# Patient Record
Sex: Male | Born: 1955 | Race: White | Hispanic: No | Marital: Married | Smoking: Never smoker
Health system: Southern US, Community
[De-identification: ages and names within clinical notes are randomized; demographics above are authoritative.]

## PROBLEM LIST (undated history)

## (undated) DIAGNOSIS — J3089 Other allergic rhinitis: Secondary | ICD-10-CM

## (undated) DIAGNOSIS — M75102 Unspecified rotator cuff tear or rupture of left shoulder, not specified as traumatic: Secondary | ICD-10-CM

## (undated) DIAGNOSIS — M1990 Unspecified osteoarthritis, unspecified site: Secondary | ICD-10-CM

## (undated) DIAGNOSIS — G4730 Sleep apnea, unspecified: Secondary | ICD-10-CM

## (undated) DIAGNOSIS — I10 Essential (primary) hypertension: Secondary | ICD-10-CM

## (undated) HISTORY — PX: TONSILLECTOMY: SUR1361

---

## 2001-07-05 ENCOUNTER — Ambulatory Visit (HOSPITAL_BASED_OUTPATIENT_CLINIC_OR_DEPARTMENT_OTHER): Admission: RE | Admit: 2001-07-05 | Discharge: 2001-07-05 | Payer: Self-pay | Admitting: Family Medicine

## 2016-02-28 HISTORY — PX: SHOULDER ARTHROSCOPY WITH ROTATOR CUFF REPAIR: SHX5685

## 2016-02-29 DIAGNOSIS — M25511 Pain in right shoulder: Secondary | ICD-10-CM | POA: Diagnosis not present

## 2016-06-13 DIAGNOSIS — Z0000 Encounter for general adult medical examination without abnormal findings: Secondary | ICD-10-CM | POA: Diagnosis not present

## 2016-06-13 DIAGNOSIS — J309 Allergic rhinitis, unspecified: Secondary | ICD-10-CM | POA: Diagnosis not present

## 2016-06-13 DIAGNOSIS — Z125 Encounter for screening for malignant neoplasm of prostate: Secondary | ICD-10-CM | POA: Diagnosis not present

## 2016-06-13 DIAGNOSIS — I10 Essential (primary) hypertension: Secondary | ICD-10-CM | POA: Diagnosis not present

## 2016-06-13 DIAGNOSIS — Z1159 Encounter for screening for other viral diseases: Secondary | ICD-10-CM | POA: Diagnosis not present

## 2016-07-06 DIAGNOSIS — M25511 Pain in right shoulder: Secondary | ICD-10-CM | POA: Diagnosis not present

## 2016-08-08 DIAGNOSIS — M67911 Unspecified disorder of synovium and tendon, right shoulder: Secondary | ICD-10-CM | POA: Diagnosis not present

## 2016-08-09 DIAGNOSIS — G4733 Obstructive sleep apnea (adult) (pediatric): Secondary | ICD-10-CM | POA: Diagnosis not present

## 2016-09-11 DIAGNOSIS — M25511 Pain in right shoulder: Secondary | ICD-10-CM | POA: Diagnosis not present

## 2016-09-13 DIAGNOSIS — M25511 Pain in right shoulder: Secondary | ICD-10-CM | POA: Diagnosis not present

## 2016-09-13 DIAGNOSIS — M67911 Unspecified disorder of synovium and tendon, right shoulder: Secondary | ICD-10-CM | POA: Diagnosis not present

## 2016-10-17 DIAGNOSIS — M25561 Pain in right knee: Secondary | ICD-10-CM | POA: Diagnosis not present

## 2016-11-21 DIAGNOSIS — S43431A Superior glenoid labrum lesion of right shoulder, initial encounter: Secondary | ICD-10-CM | POA: Diagnosis not present

## 2016-11-21 DIAGNOSIS — M25511 Pain in right shoulder: Secondary | ICD-10-CM | POA: Diagnosis not present

## 2016-11-21 DIAGNOSIS — G8929 Other chronic pain: Secondary | ICD-10-CM | POA: Diagnosis not present

## 2016-11-29 DIAGNOSIS — Z23 Encounter for immunization: Secondary | ICD-10-CM | POA: Diagnosis not present

## 2016-12-06 DIAGNOSIS — G4733 Obstructive sleep apnea (adult) (pediatric): Secondary | ICD-10-CM | POA: Diagnosis not present

## 2016-12-13 DIAGNOSIS — I10 Essential (primary) hypertension: Secondary | ICD-10-CM | POA: Diagnosis not present

## 2016-12-13 DIAGNOSIS — J309 Allergic rhinitis, unspecified: Secondary | ICD-10-CM | POA: Diagnosis not present

## 2016-12-18 DIAGNOSIS — G4733 Obstructive sleep apnea (adult) (pediatric): Secondary | ICD-10-CM | POA: Diagnosis not present

## 2017-01-01 DIAGNOSIS — S43431A Superior glenoid labrum lesion of right shoulder, initial encounter: Secondary | ICD-10-CM | POA: Diagnosis not present

## 2017-01-01 DIAGNOSIS — G8918 Other acute postprocedural pain: Secondary | ICD-10-CM | POA: Diagnosis not present

## 2017-01-01 DIAGNOSIS — M75111 Incomplete rotator cuff tear or rupture of right shoulder, not specified as traumatic: Secondary | ICD-10-CM | POA: Diagnosis not present

## 2017-01-01 DIAGNOSIS — M19011 Primary osteoarthritis, right shoulder: Secondary | ICD-10-CM | POA: Diagnosis not present

## 2017-01-01 DIAGNOSIS — S46011A Strain of muscle(s) and tendon(s) of the rotator cuff of right shoulder, initial encounter: Secondary | ICD-10-CM | POA: Diagnosis not present

## 2017-01-04 DIAGNOSIS — M25511 Pain in right shoulder: Secondary | ICD-10-CM | POA: Diagnosis not present

## 2017-01-08 DIAGNOSIS — M25511 Pain in right shoulder: Secondary | ICD-10-CM | POA: Diagnosis not present

## 2017-01-10 DIAGNOSIS — M25511 Pain in right shoulder: Secondary | ICD-10-CM | POA: Diagnosis not present

## 2017-01-15 DIAGNOSIS — M25511 Pain in right shoulder: Secondary | ICD-10-CM | POA: Diagnosis not present

## 2017-01-17 DIAGNOSIS — M25511 Pain in right shoulder: Secondary | ICD-10-CM | POA: Diagnosis not present

## 2017-01-18 DIAGNOSIS — G4733 Obstructive sleep apnea (adult) (pediatric): Secondary | ICD-10-CM | POA: Diagnosis not present

## 2017-01-22 DIAGNOSIS — M25511 Pain in right shoulder: Secondary | ICD-10-CM | POA: Diagnosis not present

## 2017-01-24 DIAGNOSIS — M25511 Pain in right shoulder: Secondary | ICD-10-CM | POA: Diagnosis not present

## 2017-01-29 DIAGNOSIS — M25511 Pain in right shoulder: Secondary | ICD-10-CM | POA: Diagnosis not present

## 2017-01-31 DIAGNOSIS — M25511 Pain in right shoulder: Secondary | ICD-10-CM | POA: Diagnosis not present

## 2017-02-07 DIAGNOSIS — M25511 Pain in right shoulder: Secondary | ICD-10-CM | POA: Diagnosis not present

## 2017-02-09 DIAGNOSIS — M25511 Pain in right shoulder: Secondary | ICD-10-CM | POA: Diagnosis not present

## 2017-02-13 DIAGNOSIS — M25511 Pain in right shoulder: Secondary | ICD-10-CM | POA: Diagnosis not present

## 2017-02-15 DIAGNOSIS — M25511 Pain in right shoulder: Secondary | ICD-10-CM | POA: Diagnosis not present

## 2017-02-17 DIAGNOSIS — G4733 Obstructive sleep apnea (adult) (pediatric): Secondary | ICD-10-CM | POA: Diagnosis not present

## 2017-02-22 DIAGNOSIS — M25511 Pain in right shoulder: Secondary | ICD-10-CM | POA: Diagnosis not present

## 2017-02-26 DIAGNOSIS — M25511 Pain in right shoulder: Secondary | ICD-10-CM | POA: Diagnosis not present

## 2017-03-01 DIAGNOSIS — M25511 Pain in right shoulder: Secondary | ICD-10-CM | POA: Diagnosis not present

## 2017-03-05 DIAGNOSIS — M25511 Pain in right shoulder: Secondary | ICD-10-CM | POA: Diagnosis not present

## 2017-03-08 DIAGNOSIS — M25511 Pain in right shoulder: Secondary | ICD-10-CM | POA: Diagnosis not present

## 2017-03-12 DIAGNOSIS — G4733 Obstructive sleep apnea (adult) (pediatric): Secondary | ICD-10-CM | POA: Diagnosis not present

## 2017-03-13 DIAGNOSIS — M25519 Pain in unspecified shoulder: Secondary | ICD-10-CM | POA: Diagnosis not present

## 2017-03-15 DIAGNOSIS — M25519 Pain in unspecified shoulder: Secondary | ICD-10-CM | POA: Diagnosis not present

## 2017-03-20 DIAGNOSIS — M25511 Pain in right shoulder: Secondary | ICD-10-CM | POA: Diagnosis not present

## 2017-03-20 DIAGNOSIS — G4733 Obstructive sleep apnea (adult) (pediatric): Secondary | ICD-10-CM | POA: Diagnosis not present

## 2017-03-22 DIAGNOSIS — M25511 Pain in right shoulder: Secondary | ICD-10-CM | POA: Diagnosis not present

## 2017-03-26 DIAGNOSIS — M25511 Pain in right shoulder: Secondary | ICD-10-CM | POA: Diagnosis not present

## 2017-03-29 DIAGNOSIS — M25519 Pain in unspecified shoulder: Secondary | ICD-10-CM | POA: Diagnosis not present

## 2017-04-02 DIAGNOSIS — M25511 Pain in right shoulder: Secondary | ICD-10-CM | POA: Diagnosis not present

## 2017-04-06 DIAGNOSIS — M25519 Pain in unspecified shoulder: Secondary | ICD-10-CM | POA: Diagnosis not present

## 2017-04-10 DIAGNOSIS — M25511 Pain in right shoulder: Secondary | ICD-10-CM | POA: Diagnosis not present

## 2017-04-11 DIAGNOSIS — M25511 Pain in right shoulder: Secondary | ICD-10-CM | POA: Diagnosis not present

## 2017-04-18 DIAGNOSIS — M25511 Pain in right shoulder: Secondary | ICD-10-CM | POA: Diagnosis not present

## 2017-04-20 DIAGNOSIS — G4733 Obstructive sleep apnea (adult) (pediatric): Secondary | ICD-10-CM | POA: Diagnosis not present

## 2017-04-25 DIAGNOSIS — M25511 Pain in right shoulder: Secondary | ICD-10-CM | POA: Diagnosis not present

## 2017-05-02 DIAGNOSIS — M25511 Pain in right shoulder: Secondary | ICD-10-CM | POA: Diagnosis not present

## 2017-05-09 DIAGNOSIS — M25511 Pain in right shoulder: Secondary | ICD-10-CM | POA: Diagnosis not present

## 2017-05-17 DIAGNOSIS — M25511 Pain in right shoulder: Secondary | ICD-10-CM | POA: Diagnosis not present

## 2017-05-17 DIAGNOSIS — M25519 Pain in unspecified shoulder: Secondary | ICD-10-CM | POA: Diagnosis not present

## 2017-05-18 DIAGNOSIS — G4733 Obstructive sleep apnea (adult) (pediatric): Secondary | ICD-10-CM | POA: Diagnosis not present

## 2017-06-18 DIAGNOSIS — G4733 Obstructive sleep apnea (adult) (pediatric): Secondary | ICD-10-CM | POA: Diagnosis not present

## 2017-06-19 DIAGNOSIS — I10 Essential (primary) hypertension: Secondary | ICD-10-CM | POA: Diagnosis not present

## 2017-06-19 DIAGNOSIS — Z0000 Encounter for general adult medical examination without abnormal findings: Secondary | ICD-10-CM | POA: Diagnosis not present

## 2017-06-19 DIAGNOSIS — J309 Allergic rhinitis, unspecified: Secondary | ICD-10-CM | POA: Diagnosis not present

## 2017-06-19 DIAGNOSIS — R7303 Prediabetes: Secondary | ICD-10-CM | POA: Diagnosis not present

## 2017-07-18 DIAGNOSIS — G4733 Obstructive sleep apnea (adult) (pediatric): Secondary | ICD-10-CM | POA: Diagnosis not present

## 2017-08-15 DIAGNOSIS — K5730 Diverticulosis of large intestine without perforation or abscess without bleeding: Secondary | ICD-10-CM | POA: Diagnosis not present

## 2017-08-15 DIAGNOSIS — Z86010 Personal history of colonic polyps: Secondary | ICD-10-CM | POA: Diagnosis not present

## 2017-08-18 DIAGNOSIS — G4733 Obstructive sleep apnea (adult) (pediatric): Secondary | ICD-10-CM | POA: Diagnosis not present

## 2017-09-17 DIAGNOSIS — G4733 Obstructive sleep apnea (adult) (pediatric): Secondary | ICD-10-CM | POA: Diagnosis not present

## 2017-12-19 DIAGNOSIS — Z23 Encounter for immunization: Secondary | ICD-10-CM | POA: Diagnosis not present

## 2017-12-19 DIAGNOSIS — J309 Allergic rhinitis, unspecified: Secondary | ICD-10-CM | POA: Diagnosis not present

## 2017-12-19 DIAGNOSIS — R7303 Prediabetes: Secondary | ICD-10-CM | POA: Diagnosis not present

## 2017-12-19 DIAGNOSIS — I10 Essential (primary) hypertension: Secondary | ICD-10-CM | POA: Diagnosis not present

## 2018-02-15 DIAGNOSIS — G4733 Obstructive sleep apnea (adult) (pediatric): Secondary | ICD-10-CM | POA: Diagnosis not present

## 2018-03-13 DIAGNOSIS — G4733 Obstructive sleep apnea (adult) (pediatric): Secondary | ICD-10-CM | POA: Diagnosis not present

## 2018-06-25 DIAGNOSIS — Z0000 Encounter for general adult medical examination without abnormal findings: Secondary | ICD-10-CM | POA: Diagnosis not present

## 2018-06-25 DIAGNOSIS — R7303 Prediabetes: Secondary | ICD-10-CM | POA: Diagnosis not present

## 2018-06-25 DIAGNOSIS — I10 Essential (primary) hypertension: Secondary | ICD-10-CM | POA: Diagnosis not present

## 2018-06-25 DIAGNOSIS — J309 Allergic rhinitis, unspecified: Secondary | ICD-10-CM | POA: Diagnosis not present

## 2018-07-02 DIAGNOSIS — I10 Essential (primary) hypertension: Secondary | ICD-10-CM | POA: Diagnosis not present

## 2018-07-02 DIAGNOSIS — R7303 Prediabetes: Secondary | ICD-10-CM | POA: Diagnosis not present

## 2018-11-06 ENCOUNTER — Other Ambulatory Visit: Payer: Self-pay

## 2018-11-06 ENCOUNTER — Ambulatory Visit
Admission: RE | Admit: 2018-11-06 | Discharge: 2018-11-06 | Disposition: A | Discharge status: Home / Self Care | Payer: Self-pay | Source: Ambulatory Visit | Attending: Family Medicine | Admitting: Family Medicine

## 2018-11-06 ENCOUNTER — Other Ambulatory Visit: Payer: Self-pay | Admitting: Family Medicine

## 2018-11-06 DIAGNOSIS — R059 Cough, unspecified: Secondary | ICD-10-CM

## 2018-11-06 DIAGNOSIS — Z23 Encounter for immunization: Secondary | ICD-10-CM | POA: Diagnosis not present

## 2018-11-06 DIAGNOSIS — J309 Allergic rhinitis, unspecified: Secondary | ICD-10-CM | POA: Diagnosis not present

## 2018-11-06 DIAGNOSIS — I10 Essential (primary) hypertension: Secondary | ICD-10-CM | POA: Diagnosis not present

## 2018-11-06 DIAGNOSIS — R05 Cough: Secondary | ICD-10-CM

## 2018-11-06 DIAGNOSIS — Z6835 Body mass index (BMI) 35.0-35.9, adult: Secondary | ICD-10-CM | POA: Diagnosis not present

## 2019-01-02 DIAGNOSIS — R7303 Prediabetes: Secondary | ICD-10-CM | POA: Diagnosis not present

## 2019-01-02 DIAGNOSIS — J309 Allergic rhinitis, unspecified: Secondary | ICD-10-CM | POA: Diagnosis not present

## 2019-01-02 DIAGNOSIS — I10 Essential (primary) hypertension: Secondary | ICD-10-CM | POA: Diagnosis not present

## 2019-03-12 DIAGNOSIS — G4733 Obstructive sleep apnea (adult) (pediatric): Secondary | ICD-10-CM | POA: Diagnosis not present

## 2019-03-24 DIAGNOSIS — G4733 Obstructive sleep apnea (adult) (pediatric): Secondary | ICD-10-CM | POA: Diagnosis not present

## 2019-04-24 ENCOUNTER — Ambulatory Visit: Payer: PRIVATE HEALTH INSURANCE | Attending: Internal Medicine

## 2019-04-24 DIAGNOSIS — Z23 Encounter for immunization: Secondary | ICD-10-CM | POA: Insufficient documentation

## 2019-04-24 NOTE — Progress Notes (Signed)
   Covid-19 Vaccination Clinic  Name:  Alfred Davis    MRN: [id number] DOB: 1955/12/02  04/24/2019  Alfred Davis was observed post Covid-19 immunization for 15 minutes without incidence. He was provided with Vaccine Information Sheet and instruction to access the V-Safe system.   Alfred Davis was instructed to call 911 with any severe reactions post vaccine: Marland Kitchen Difficulty breathing  . Swelling of your face and throat  . A fast heartbeat  . A bad rash all over your body  . Dizziness and weakness    Immunizations Administered    Name Date Dose VIS Date Route   Pfizer COVID-19 Vaccine 04/24/2019  5:08 PM 0.3 mL 02/07/2019 Intramuscular   Manufacturer: [employer], [employer]   Lot: [id number]   NDC: [address]-6

## 2019-05-20 ENCOUNTER — Ambulatory Visit: Payer: PRIVATE HEALTH INSURANCE | Attending: Internal Medicine

## 2019-05-20 DIAGNOSIS — Z23 Encounter for immunization: Secondary | ICD-10-CM

## 2019-05-20 NOTE — Progress Notes (Signed)
   Covid-19 Vaccination Clinic  Name:  Alfred Davis    MRN: [id number] DOB: 1955-06-25  05/20/2019  Alfred Davis was observed post Covid-19 immunization for 15 minutes without incident. He was provided with Vaccine Information Sheet and instruction to access the V-Safe system.   Alfred Davis was instructed to call 911 with any severe reactions post vaccine: Marland Kitchen Difficulty breathing  . Swelling of face and throat  . A fast heartbeat  . A bad rash all over body  . Dizziness and weakness   Immunizations Administered    Name Date Dose VIS Date Route   Pfizer COVID-19 Vaccine 05/20/2019 10:48 AM 0.3 mL 02/07/2019 Intramuscular   Manufacturer: [employer], [employer]   Lot: [id number]   NDC: [address]-0

## 2019-07-02 DIAGNOSIS — Z0000 Encounter for general adult medical examination without abnormal findings: Secondary | ICD-10-CM | POA: Diagnosis not present

## 2019-07-02 DIAGNOSIS — R7309 Other abnormal glucose: Secondary | ICD-10-CM | POA: Diagnosis not present

## 2019-07-02 DIAGNOSIS — J309 Allergic rhinitis, unspecified: Secondary | ICD-10-CM | POA: Diagnosis not present

## 2019-07-02 DIAGNOSIS — Z125 Encounter for screening for malignant neoplasm of prostate: Secondary | ICD-10-CM | POA: Diagnosis not present

## 2019-07-02 DIAGNOSIS — I10 Essential (primary) hypertension: Secondary | ICD-10-CM | POA: Diagnosis not present

## 2019-11-25 ENCOUNTER — Ambulatory Visit: Payer: Self-pay | Attending: Internal Medicine

## 2019-11-25 DIAGNOSIS — Z23 Encounter for immunization: Secondary | ICD-10-CM

## 2019-11-25 NOTE — Progress Notes (Signed)
   Covid-19 Vaccination Clinic  Name:  Heraclio Seidman    MRN: [id number] DOB: 19-May-1955  11/25/2019  Mr. Keep was observed post Covid-19 immunization for 15 minutes without incident. He was provided with Vaccine Information Sheet and instruction to access the V-Safe system.   Mr. Martin was instructed to call 911 with any severe reactions post vaccine: Marland Kitchen Difficulty breathing  . Swelling of face and throat  . A fast heartbeat  . A bad rash all over body  . Dizziness and weakness

## 2020-01-09 ENCOUNTER — Other Ambulatory Visit: Payer: Self-pay | Admitting: Orthopedic Surgery

## 2020-01-09 DIAGNOSIS — M25512 Pain in left shoulder: Secondary | ICD-10-CM

## 2020-02-01 ENCOUNTER — Other Ambulatory Visit: Payer: Self-pay

## 2020-02-01 ENCOUNTER — Ambulatory Visit
Admission: RE | Admit: 2020-02-01 | Discharge: 2020-02-01 | Disposition: A | Discharge status: Home / Self Care | Payer: 59 | Source: Ambulatory Visit | Attending: Orthopedic Surgery | Admitting: Orthopedic Surgery

## 2020-02-01 DIAGNOSIS — M25512 Pain in left shoulder: Secondary | ICD-10-CM

## 2020-02-26 ENCOUNTER — Other Ambulatory Visit: Payer: Self-pay | Admitting: Orthopedic Surgery

## 2020-03-09 ENCOUNTER — Encounter (HOSPITAL_BASED_OUTPATIENT_CLINIC_OR_DEPARTMENT_OTHER): Payer: Self-pay | Admitting: Orthopedic Surgery

## 2020-03-09 ENCOUNTER — Other Ambulatory Visit: Payer: Self-pay

## 2020-03-11 ENCOUNTER — Other Ambulatory Visit (HOSPITAL_COMMUNITY): Payer: 59

## 2020-03-12 ENCOUNTER — Encounter (HOSPITAL_BASED_OUTPATIENT_CLINIC_OR_DEPARTMENT_OTHER)
Admission: RE | Admit: 2020-03-12 | Discharge: 2020-03-12 | Disposition: A | Discharge status: Home / Self Care | Payer: 59 | Source: Ambulatory Visit | Attending: Orthopedic Surgery | Admitting: Orthopedic Surgery

## 2020-03-12 ENCOUNTER — Other Ambulatory Visit: Payer: Self-pay

## 2020-03-12 ENCOUNTER — Other Ambulatory Visit (HOSPITAL_COMMUNITY)
Admission: RE | Admit: 2020-03-12 | Discharge: 2020-03-12 | Disposition: A | Discharge status: Home / Self Care | Payer: 59 | Source: Ambulatory Visit | Attending: Orthopedic Surgery | Admitting: Orthopedic Surgery

## 2020-03-12 DIAGNOSIS — Z20822 Contact with and (suspected) exposure to covid-19: Secondary | ICD-10-CM | POA: Insufficient documentation

## 2020-03-12 DIAGNOSIS — Z01818 Encounter for other preprocedural examination: Secondary | ICD-10-CM | POA: Diagnosis not present

## 2020-03-12 DIAGNOSIS — Z01812 Encounter for preprocedural laboratory examination: Secondary | ICD-10-CM | POA: Insufficient documentation

## 2020-03-12 DIAGNOSIS — I10 Essential (primary) hypertension: Secondary | ICD-10-CM | POA: Diagnosis not present

## 2020-03-12 LAB — BASIC METABOLIC PANEL
Anion gap: 9 (ref 5–15)
BUN: 15 mg/dL (ref 8–23)
CO2: 26 mmol/L (ref 22–32)
Calcium: 9.4 mg/dL (ref 8.9–10.3)
Chloride: 102 mmol/L (ref 98–111)
Creatinine, Ser: 1.03 mg/dL (ref 0.61–1.24)
GFR, Estimated: >60 mL/min (ref >60)
Glucose, Bld: 114 mg/dL — ABNORMAL HIGH (ref 70–99)
Potassium: 4.2 mmol/L (ref 3.5–5.1)
Sodium: 137 mmol/L (ref 135–145)

## 2020-03-12 LAB — SARS CORONAVIRUS 2 (TAT 6-24 HRS): SARS Coronavirus 2: NEGATIVE

## 2020-03-12 NOTE — Progress Notes (Signed)
EKG reviewed with Dr. Krista Blue. Ok to proceed with surgery as scheduled.

## 2020-03-12 NOTE — Progress Notes (Signed)

## 2020-03-15 ENCOUNTER — Ambulatory Visit (HOSPITAL_BASED_OUTPATIENT_CLINIC_OR_DEPARTMENT_OTHER)
Admission: RE | Admit: 2020-03-15 | Discharge: 2020-03-15 | Disposition: A | Discharge status: Home / Self Care | Payer: 59 | Attending: Orthopedic Surgery | Admitting: Orthopedic Surgery

## 2020-03-15 ENCOUNTER — Encounter (HOSPITAL_BASED_OUTPATIENT_CLINIC_OR_DEPARTMENT_OTHER): Payer: Self-pay | Admitting: Orthopedic Surgery

## 2020-03-15 ENCOUNTER — Ambulatory Visit (HOSPITAL_BASED_OUTPATIENT_CLINIC_OR_DEPARTMENT_OTHER): Payer: 59 | Admitting: Anesthesiology

## 2020-03-15 ENCOUNTER — Other Ambulatory Visit: Payer: Self-pay

## 2020-03-15 ENCOUNTER — Encounter (HOSPITAL_BASED_OUTPATIENT_CLINIC_OR_DEPARTMENT_OTHER)
Admission: RE | Disposition: A | Discharge status: Home / Self Care | Payer: Self-pay | Source: Home / Self Care | Attending: Orthopedic Surgery

## 2020-03-15 DIAGNOSIS — Z79899 Other long term (current) drug therapy: Secondary | ICD-10-CM | POA: Insufficient documentation

## 2020-03-15 DIAGNOSIS — X58XXXA Exposure to other specified factors, initial encounter: Secondary | ICD-10-CM | POA: Insufficient documentation

## 2020-03-15 DIAGNOSIS — M24412 Recurrent dislocation, left shoulder: Secondary | ICD-10-CM | POA: Insufficient documentation

## 2020-03-15 DIAGNOSIS — S46012A Strain of muscle(s) and tendon(s) of the rotator cuff of left shoulder, initial encounter: Secondary | ICD-10-CM | POA: Diagnosis not present

## 2020-03-15 DIAGNOSIS — M7502 Adhesive capsulitis of left shoulder: Secondary | ICD-10-CM | POA: Diagnosis not present

## 2020-03-15 DIAGNOSIS — Z7982 Long term (current) use of aspirin: Secondary | ICD-10-CM | POA: Insufficient documentation

## 2020-03-15 HISTORY — DX: Unspecified rotator cuff tear or rupture of left shoulder, not specified as traumatic: M75.102

## 2020-03-15 HISTORY — DX: Essential (primary) hypertension: I10

## 2020-03-15 HISTORY — DX: Sleep apnea, unspecified: G47.30

## 2020-03-15 HISTORY — DX: Other allergic rhinitis: J30.89

## 2020-03-15 HISTORY — DX: Unspecified osteoarthritis, unspecified site: M19.90

## 2020-03-15 HISTORY — PX: SHOULDER ARTHROSCOPY: SHX128

## 2020-03-15 SURGERY — ARTHROSCOPY, SHOULDER
Anesthesia: Regional | Site: Shoulder | Laterality: Left

## 2020-03-15 MED ORDER — BUPIVACAINE HCL (PF) 0.5 % IJ SOLN
INTRAMUSCULAR | Status: AC
  Filled 2020-03-15: qty 30

## 2020-03-15 MED ORDER — DEXAMETHASONE SODIUM PHOSPHATE 10 MG/ML IJ SOLN
INTRAMUSCULAR | Status: AC
  Filled 2020-03-15: qty 1

## 2020-03-15 MED ORDER — LACTATED RINGERS IV SOLN: INTRAVENOUS | Status: DC

## 2020-03-15 MED ORDER — SUGAMMADEX SODIUM 500 MG/5ML IV SOLN
INTRAVENOUS | Status: DC | PRN
  Administered 2020-03-15: 300 mg via INTRAVENOUS

## 2020-03-15 MED ORDER — ACETAMINOPHEN 500 MG PO TABS
1000.0000 mg | ORAL_TABLET | Freq: Once | ORAL | Status: AC
  Administered 2020-03-15: 1000 mg via ORAL

## 2020-03-15 MED ORDER — OXYCODONE-ACETAMINOPHEN 5-325 MG PO TABS: ORAL_TABLET | ORAL | 0 refills | Status: DC

## 2020-03-15 MED ORDER — DEXAMETHASONE SODIUM PHOSPHATE 4 MG/ML IJ SOLN
INTRAMUSCULAR | Status: DC | PRN
  Administered 2020-03-15: 10 mg via INTRAVENOUS

## 2020-03-15 MED ORDER — BUPIVACAINE HCL (PF) 0.5 % IJ SOLN
INTRAMUSCULAR | Status: DC | PRN
  Administered 2020-03-15: 7 mL

## 2020-03-15 MED ORDER — MIDAZOLAM HCL 2 MG/2ML IJ SOLN
INTRAMUSCULAR | Status: AC
  Filled 2020-03-15: qty 2

## 2020-03-15 MED ORDER — FENTANYL CITRATE (PF) 100 MCG/2ML IJ SOLN
INTRAMUSCULAR | Status: DC | PRN
  Administered 2020-03-15: 50 ug via INTRAVENOUS

## 2020-03-15 MED ORDER — EPHEDRINE SULFATE 50 MG/ML IJ SOLN
INTRAMUSCULAR | Status: DC | PRN
  Administered 2020-03-15: 10 mg via INTRAVENOUS
  Administered 2020-03-15: 5 mg via INTRAVENOUS

## 2020-03-15 MED ORDER — BUPIVACAINE LIPOSOME 1.3 % IJ SUSP
INTRAMUSCULAR | Status: DC | PRN
  Administered 2020-03-15: 10 mL via PERINEURAL

## 2020-03-15 MED ORDER — ROCURONIUM BROMIDE 10 MG/ML (PF) SYRINGE
PREFILLED_SYRINGE | INTRAVENOUS | Status: AC
  Filled 2020-03-15: qty 10

## 2020-03-15 MED ORDER — CEFAZOLIN SODIUM-DEXTROSE 2-4 GM/100ML-% IV SOLN
2.0000 g | INTRAVENOUS | Status: AC
  Administered 2020-03-15: 2 g via INTRAVENOUS

## 2020-03-15 MED ORDER — LIDOCAINE 2% (20 MG/ML) 5 ML SYRINGE
INTRAMUSCULAR | Status: AC
  Filled 2020-03-15: qty 5

## 2020-03-15 MED ORDER — ACETAMINOPHEN 500 MG PO TABS
ORAL_TABLET | ORAL | Status: AC
  Filled 2020-03-15: qty 2

## 2020-03-15 MED ORDER — ROCURONIUM BROMIDE 100 MG/10ML IV SOLN
INTRAVENOUS | Status: DC | PRN
  Administered 2020-03-15: 70 mg via INTRAVENOUS

## 2020-03-15 MED ORDER — TRIAMCINOLONE ACETONIDE 40 MG/ML IJ SUSP
INTRAMUSCULAR | Status: AC
  Filled 2020-03-15: qty 5

## 2020-03-15 MED ORDER — LIDOCAINE HCL (CARDIAC) PF 100 MG/5ML IV SOSY
PREFILLED_SYRINGE | INTRAVENOUS | Status: DC | PRN
  Administered 2020-03-15: 20 mg via INTRAVENOUS

## 2020-03-15 MED ORDER — KETOROLAC TROMETHAMINE 30 MG/ML IJ SOLN: 30.0000 mg | Freq: Once | INTRAMUSCULAR | Status: DC | PRN

## 2020-03-15 MED ORDER — FENTANYL CITRATE (PF) 100 MCG/2ML IJ SOLN
50.0000 ug | Freq: Once | INTRAMUSCULAR | Status: AC
  Administered 2020-03-15: 50 ug via INTRAVENOUS

## 2020-03-15 MED ORDER — MEPERIDINE HCL 25 MG/ML IJ SOLN
6.2500 mg | INTRAMUSCULAR | Status: DC | PRN
Start: 2020-03-15 — End: 2020-03-15

## 2020-03-15 MED ORDER — FENTANYL CITRATE (PF) 100 MCG/2ML IJ SOLN
INTRAMUSCULAR | Status: AC
  Filled 2020-03-15: qty 2

## 2020-03-15 MED ORDER — PROMETHAZINE HCL 25 MG/ML IJ SOLN: 6.2500 mg | INTRAMUSCULAR | Status: DC | PRN

## 2020-03-15 MED ORDER — ONDANSETRON HCL 4 MG/2ML IJ SOLN
INTRAMUSCULAR | Status: AC
  Filled 2020-03-15: qty 2

## 2020-03-15 MED ORDER — TIZANIDINE HCL 4 MG PO TABS: 4.0000 mg | ORAL_TABLET | Freq: Three times a day (TID) | ORAL | 1 refills | Status: DC | PRN

## 2020-03-15 MED ORDER — PROPOFOL 10 MG/ML IV BOLUS
INTRAVENOUS | Status: DC | PRN
  Administered 2020-03-15: 150 mg via INTRAVENOUS

## 2020-03-15 MED ORDER — HYDROMORPHONE HCL 1 MG/ML IJ SOLN: 0.2500 mg | INTRAMUSCULAR | Status: DC | PRN

## 2020-03-15 MED ORDER — BUPIVACAINE HCL (PF) 0.5 % IJ SOLN
INTRAMUSCULAR | Status: DC | PRN
  Administered 2020-03-15: 20 mL via PERINEURAL

## 2020-03-15 MED ORDER — CEFAZOLIN SODIUM-DEXTROSE 2-4 GM/100ML-% IV SOLN
INTRAVENOUS | Status: AC
  Filled 2020-03-15: qty 100

## 2020-03-15 MED ORDER — MIDAZOLAM HCL 2 MG/2ML IJ SOLN
1.0000 mg | Freq: Once | INTRAMUSCULAR | Status: AC
  Administered 2020-03-15: 1 mg via INTRAVENOUS

## 2020-03-15 MED ORDER — OXYCODONE HCL 5 MG/5ML PO SOLN: 5.0000 mg | Freq: Once | ORAL | Status: DC | PRN

## 2020-03-15 MED ORDER — OXYCODONE HCL 5 MG PO TABS: 5.0000 mg | ORAL_TABLET | Freq: Once | ORAL | Status: DC | PRN

## 2020-03-15 MED ORDER — PROPOFOL 10 MG/ML IV BOLUS
INTRAVENOUS | Status: AC
  Filled 2020-03-15: qty 20

## 2020-03-15 MED ORDER — ONDANSETRON HCL 4 MG/2ML IJ SOLN
INTRAMUSCULAR | Status: DC | PRN
  Administered 2020-03-15: 4 mg via INTRAVENOUS

## 2020-03-15 SURGICAL SUPPLY — 57 items
AID PSTN UNV HD RSTRNT DISP (MISCELLANEOUS) ×2
APL PRP STRL LF DISP 70% ISPRP (MISCELLANEOUS) ×2
APL SKNCLS STERI-STRIP NONHPOA (GAUZE/BANDAGES/DRESSINGS)
BENZOIN TINCTURE PRP APPL 2/3 (GAUZE/BANDAGES/DRESSINGS) IMPLANT
BURR OVAL 8 FLU 4.0X13 (MISCELLANEOUS) ×3 IMPLANT
CANNULA 5.75X7 CRYSTAL CLEAR (CANNULA) ×3 IMPLANT
CANNULA TWIST IN 8.25X7CM (CANNULA) ×2 IMPLANT
CHLORAPREP W/TINT 26 (MISCELLANEOUS) ×3 IMPLANT
COVER WAND RF STERILE (DRAPES) IMPLANT
CUTTER BONE 4.0MM X 13CM (MISCELLANEOUS) ×3 IMPLANT
DECANTER SPIKE VIAL GLASS SM (MISCELLANEOUS) IMPLANT
DRAPE IMP U-DRAPE 54X76 (DRAPES) ×3 IMPLANT
DRAPE INCISE IOBAN 66X45 STRL (DRAPES) ×3 IMPLANT
DRAPE STERI 35X30 U-POUCH (DRAPES) ×3 IMPLANT
DRAPE SURG 17X23 STRL (DRAPES) ×3 IMPLANT
DRAPE U-SHAPE 47X51 STRL (DRAPES) ×3 IMPLANT
DRAPE U-SHAPE 76X120 STRL (DRAPES) ×6 IMPLANT
DRSG PAD ABDOMINAL 8X10 ST (GAUZE/BANDAGES/DRESSINGS) ×3 IMPLANT
ELECT REM PT RETURN 9FT ADLT (ELECTROSURGICAL) ×3
ELECTRODE REM PT RTRN 9FT ADLT (ELECTROSURGICAL) ×2 IMPLANT
GAUZE 4X4 16PLY RFD (DISPOSABLE) IMPLANT
GAUZE SPONGE 4X4 12PLY STRL (GAUZE/BANDAGES/DRESSINGS) ×3 IMPLANT
GAUZE XEROFORM 1X8 LF (GAUZE/BANDAGES/DRESSINGS) ×3 IMPLANT
GLOVE BIO SURGEON STRL SZ7.5 (GLOVE) ×3 IMPLANT
GLOVE SRG 8 PF TXTR STRL LF DI (GLOVE) ×2 IMPLANT
GLOVE SURG ENC MOIS LTX SZ7 (GLOVE) ×5 IMPLANT
GLOVE SURG UNDER POLY LF SZ7 (GLOVE) ×3 IMPLANT
GLOVE SURG UNDER POLY LF SZ8 (GLOVE) ×3
GOWN STRL REUS W/ TWL LRG LVL3 (GOWN DISPOSABLE) ×4 IMPLANT
GOWN STRL REUS W/ TWL XL LVL3 (GOWN DISPOSABLE) ×2 IMPLANT
GOWN STRL REUS W/TWL LRG LVL3 (GOWN DISPOSABLE) ×6
GOWN STRL REUS W/TWL XL LVL3 (GOWN DISPOSABLE) ×3
LASSO CRESCENT QUICKPASS (SUTURE) IMPLANT
MANIFOLD NEPTUNE II (INSTRUMENTS) ×3 IMPLANT
NDL SCORPION MULTI FIRE (NEEDLE) IMPLANT
NEEDLE SCORPION MULTI FIRE (NEEDLE) ×3 IMPLANT
NS IRRIG 1000ML POUR BTL (IV SOLUTION) IMPLANT
PACK ARTHROSCOPY DSU (CUSTOM PROCEDURE TRAY) ×3 IMPLANT
PACK BASIN DAY SURGERY FS (CUSTOM PROCEDURE TRAY) ×3 IMPLANT
PROBE BIPOLAR ATHRO 135MM 90D (MISCELLANEOUS) ×3 IMPLANT
RESTRAINT HEAD UNIVERSAL NS (MISCELLANEOUS) ×3 IMPLANT
SLEEVE SCD COMPRESS KNEE MED (MISCELLANEOUS) ×3 IMPLANT
SLING ARM FOAM STRAP LRG (SOFTGOODS) ×2 IMPLANT
STRIP CLOSURE SKIN 1/2X4 (GAUZE/BANDAGES/DRESSINGS) IMPLANT
SUPPORT WRAP ARM LG (MISCELLANEOUS) IMPLANT
SUT ETHILON 3 0 PS 1 (SUTURE) ×3 IMPLANT
SUT FIBERWIRE #2 38 T-5 BLUE (SUTURE)
SUT PDS AB 0 CT 36 (SUTURE) IMPLANT
SUT TIGER TAPE 7 IN WHITE (SUTURE) IMPLANT
SUTURE FIBERWR #2 38 T-5 BLUE (SUTURE) IMPLANT
SUTURE TAPE 1.3 40 TPR END (SUTURE) IMPLANT
SUTURETAPE 1.3 40 TPR END (SUTURE)
TAPE FIBER 2MM 7IN #2 BLUE (SUTURE) IMPLANT
TOWEL GREEN STERILE FF (TOWEL DISPOSABLE) ×3 IMPLANT
TUBE CONNECTING 20X1/4 (TUBING) ×3 IMPLANT
TUBING ARTHROSCOPY IRRIG 16FT (MISCELLANEOUS) ×3 IMPLANT
WATER STERILE IRR 1000ML POUR (IV SOLUTION) ×3 IMPLANT

## 2020-03-15 NOTE — Transfer of Care (Signed)
Immediate Anesthesia Transfer of Care Note  Patient: Alfred Davis  Procedure(s) Performed: SHOULDER ARTHROSCOPY WITH ROTATOR CUFF REPAIR AND SUBACROMIAL DECOMPRESSION (Left Shoulder)  Patient Location: PACU  Anesthesia Type:General  Level of Consciousness: drowsy, patient cooperative and responds to stimulation  Airway & Oxygen Therapy: Patient Spontanous Breathing and Patient connected to face mask oxygen  Post-op Assessment: Report given to RN and Post -op Vital signs reviewed and stable  Post vital signs: Reviewed and stable  Last Vitals:  Vitals Value Taken Time  BP    Temp    Pulse 92 03/15/20 1257  Resp 19 03/15/20 1257  SpO2 100 % 03/15/20 1257  Vitals shown include unvalidated device data.  Last Pain:  Vitals:   03/15/20 1051  TempSrc: Oral  PainSc: 0-No pain         Complications: No complications documented.

## 2020-03-15 NOTE — Progress Notes (Signed)
Assisted Dr. Finucane with left, ultrasound guided, interscalene  block. Side rails up, monitors on throughout procedure. See vital signs in flow sheet. Tolerated Procedure well. 

## 2020-03-15 NOTE — Anesthesia Procedure Notes (Signed)
Anesthesia Regional Block: Interscalene brachial plexus block   Pre-Anesthetic Checklist: ,, timeout performed, Correct Patient, Correct Site, Correct Laterality, Correct Procedure, Correct Position, site marked, Risks and benefits discussed,  Surgical consent,  Pre-op evaluation,  At surgeon's request and post-op pain management  Laterality: Left  Prep: Maximum Sterile Barrier Precautions used, chloraprep       Needles:  Injection technique: Single-shot  Needle Type: Echogenic Stimulator Needle     Needle Length: 9cm  Needle Gauge: 22     Additional Needles:   Procedures:,,,, ultrasound used (permanent image in chart),,,,  Narrative:  Start time: 03/15/2020 11:30 AM End time: 03/15/2020 11:40 AM Injection made incrementally with aspirations every 5 mL.  Performed by: Personally  Anesthesiologist: Lannie Fields, DO  Additional Notes: Monitors applied. No increased pain on injection. No increased resistance to injection. Injection made in 5cc increments. Good needle visualization. Patient tolerated procedure well.

## 2020-03-15 NOTE — Progress Notes (Signed)
Received call from patient's wife regarding some ptosis developing on the same side as the nerve block/surgery. D/w pt and wife that this is a known complication of the interscalene nerve block and should resolve by tomorrow or the following day at the latest. Told them to call the [facility] with any other issues.

## 2020-03-15 NOTE — Addendum Note (Signed)
Addendum  created 03/15/20 1554 by Lannie Fields, DO   Clinical Note Signed

## 2020-03-15 NOTE — Anesthesia Procedure Notes (Signed)
Procedure Name: Intubation Date/Time: 03/15/2020 11:58 AM Performed by: Thornell Mule, CRNA Pre-anesthesia Checklist: Patient identified, Emergency Drugs available, Suction available and Patient being monitored Patient Re-evaluated:Patient Re-evaluated prior to induction Oxygen Delivery Method: Circle system utilized Preoxygenation: Pre-oxygenation with 100% oxygen Induction Type: IV induction Ventilation: Mask ventilation without difficulty Laryngoscope Size: Miller and 3 Grade View: Grade I Tube type: Oral Tube size: 7.5 mm Number of attempts: 1 Airway Equipment and Method: Stylet and Oral airway Placement Confirmation: ETT inserted through vocal cords under direct vision,  positive ETCO2 and breath sounds checked- equal and bilateral Secured at: 24 cm Tube secured with: Tape Dental Injury: Teeth and Oropharynx as per pre-operative assessment

## 2020-03-15 NOTE — H&P (Signed)
Alfred Davis is an 65 y.o. male.   Chief Complaint: L shoulder injury  HPI: L shoulder large RCT after dislocation.  Past Medical History:  Diagnosis Date  . Arthritis   . Environmental and seasonal allergies   . Hypertension   . Left rotator cuff tear   . Sleep apnea    uses CPAP nightly    Past Surgical History:  Procedure Laterality Date  . SHOULDER ARTHROSCOPY WITH ROTATOR CUFF REPAIR Right 2018  . TONSILLECTOMY      History reviewed. No pertinent family history. Social History:  reports that he has never smoked. He has never used smokeless tobacco. He reports current alcohol use. He reports that he does not use drugs.  Allergies: No Known Allergies  Medications Prior to Admission  Medication Sig Dispense Refill  . aspirin EC 81 MG tablet Take 81 mg by mouth daily. Swallow whole.    . famotidine (PEPCID) 40 MG tablet Take 40 mg by mouth daily.    . valsartan-hydrochlorothiazide (DIOVAN-HCT) 320-12.5 MG tablet Take 1 tablet by mouth daily.    . cetirizine (ZYRTEC) 10 MG tablet Take 10 mg by mouth daily.      No results found for this or any previous visit (from the past 48 hour(s)). No results found.  Review of Systems  All other systems reviewed and are negative.   Blood pressure 131/75, pulse 64, temperature 98.3 F (36.8 C), temperature source Oral, resp. rate 16, height 5\' 10"  (1.778 m), weight 118 kg, SpO2 98 %. Physical Exam Constitutional:      Appearance: He is well-developed and well-nourished.  HENT:     Head: Atraumatic.  Eyes:     Extraocular Movements: EOM normal.  Cardiovascular:     Pulses: Intact distal pulses.  Pulmonary:     Effort: Pulmonary effort is normal.  Musculoskeletal:     Comments: L shoulder pain and weakness with RC testing.  Skin:    General: Skin is warm and dry.  Neurological:     Mental Status: He is alert and oriented to person, place, and time.  Psychiatric:        Mood and Affect: Mood and affect normal.       Assessment/Plan L shoulder large RCT after dislocation. Plan L arth RCR/SAD Risks / benefits of surgery discussed Consent on chart  NPO for OR Preop antibiotics   , MD 03/15/2020, 11:24 AM

## 2020-03-15 NOTE — Discharge Instructions (Signed)
Discharge Instructions after Arthroscopic Shoulder Surgery   A sling has been provided for you. You may remove the sling after 72 hours. The sling may be worn for your protection, if you are in a crowd.  Use ice on the shoulder intermittently over the first 48 hours after surgery.  Pain medication has been prescribed for you.  Use your medication liberally over the first 48 hours, and then begin to taper your use. You may take Extra Strength Tylenol or Tylenol only in place of the pain pills. DO NOT take ANY nonsteroidal anti-inflammatory pain medications: Advil, Motrin, Ibuprofen, Aleve, Naproxen, or Naprosyn.  You may remove your dressing after two days.  You may shower 5 days after surgery. The incision CANNOT get wet prior to 5 days. Simply allow the water to wash over the site and then pat dry. Do not rub the incision. Make sure your axilla (armpit) is completely dry after showering.  Take one aspirin a day for 2 weeks after surgery, unless you have an aspirin sensitivity/allergy or asthma.  Three to 5 times each day you should perform assisted overhead reaching and external rotation (outward turning) exercises with the operative arm. Both exercises should be done with the non-operative arm used as the "therapist arm" while the operative arm remains relaxed. Ten of each exercise should be done three to five times each day.    Overhead reach is helping to lift your stiff arm up as high as it will go. To stretch your overhead reach, lie flat on your back, relax, and grasp the wrist of the tight shoulder with your opposite hand. Using the power in your opposite arm, bring the stiff arm up as far as it is comfortable. Start holding it for ten seconds and then work up to where you can hold it for a count of 30. Breathe slowly and deeply while the arm is moved. Repeat this stretch ten times, trying to help the arm up a little higher each time.       External rotation is turning the arm out to  the side while your elbow stays close to your body. External rotation is best stretched while you are lying on your back. Hold a cane, yardstick, broom handle, or dowel in both hands. Bend both elbows to a right angle. Use steady, gentle force from your normal arm to rotate the hand of the stiff shoulder out away from your body. Continue the rotation as far as it will go comfortably, holding it there for a count of 10. Repeat this exercise ten times.     Please call [phone] during normal business hours or [phone] after hours for any problems. Including the following:  - excessive redness of the incisions - drainage for more than 4 days - fever of more than 101.5 F  *Please note that pain medications will not be refilled after hours or on weekends.  NO TYLENOL BEFORE 4:52pm TONIGHT.   Post Anesthesia Home Care Instructions  Activity: Get plenty of rest for the remainder of the day. A responsible individual must stay with you for 24 hours following the procedure.  For the next 24 hours, DO NOT: -Drive a car -Advertising copywriter -Drink alcoholic beverages -Take any medication unless instructed by your physician -Make any legal decisions or sign important papers.  Meals: Start with liquid foods such as gelatin or soup. Progress to regular foods as tolerated. Avoid greasy, spicy, heavy foods. If nausea and/or vomiting occur, drink only clear liquids until the  nausea and/or vomiting subsides. Call your physician if vomiting continues.  Special Instructions/Symptoms: Your throat may feel dry or sore from the anesthesia or the breathing tube placed in your throat during surgery. If this causes discomfort, gargle with warm salt water. The discomfort should disappear within 24 hours.  If you had a scopolamine patch placed behind your ear for the management of post- operative nausea and/or vomiting:  1. The medication in the patch is effective for 72 hours, after which it should be  removed.  Wrap patch in a tissue and discard in the trash. Wash hands thoroughly with soap and water. 2. You may remove the patch earlier than 72 hours if you experience unpleasant side effects which may include dry mouth, dizziness or visual disturbances. 3. Avoid touching the patch. Wash your hands with soap and water after contact with the patch.    Regional Anesthesia Blocks  1. Numbness or the inability to move the "blocked" extremity may last from 3-48 hours after placement. The length of time depends on the medication injected and your individual response to the medication. If the numbness is not going away after 48 hours, call your surgeon.  2. The extremity that is blocked will need to be protected until the numbness is gone and the  Strength has returned. Because you cannot feel it, you will need to take extra care to avoid injury. Because it may be weak, you may have difficulty moving it or using it. You may not know what position it is in without looking at it while the block is in effect.  3. For blocks in the legs and feet, returning to weight bearing and walking needs to be done carefully. You will need to wait until the numbness is entirely gone and the strength has returned. You should be able to move your leg and foot normally before you try and bear weight or walk. You will need someone to be with you when you first try to ensure you do not fall and possibly risk injury.  4. Bruising and tenderness at the needle site are common side effects and will resolve in a few days.  5. Persistent numbness or new problems with movement should be communicated to the surgeon or the [facility] [id number] [facility] [phone]).Information for Discharge Teaching: EXPAREL (bupivacaine liposome injectable suspension)   Your surgeon or anesthesiologist gave you EXPAREL(bupivacaine) to help control your pain after surgery.   EXPAREL is a local anesthetic that  provides pain relief by numbing the tissue around the surgical site.  EXPAREL is designed to release pain medication over time and can control pain for up to 72 hours.  Depending on how you respond to EXPAREL, you may require less pain medication during your recovery.  Possible side effects:  Temporary loss of sensation or ability to move in the area where bupivacaine was injected.  Nausea, vomiting, constipation  Rarely, numbness and tingling in your mouth or lips, lightheadedness, or anxiety may occur.  Call your doctor right away if you think you may be experiencing any of these sensations, or if you have other questions regarding possible side effects.  Follow all other discharge instructions given to you by your surgeon or nurse. Eat a healthy diet and drink plenty of water or other fluids.  If you return to the hospital for any reason within 96 hours following the administration of EXPAREL, it is important for health care providers to know that you have received this  anesthetic. A teal colored band has been placed on your arm with the date, time and amount of EXPAREL you have received in order to alert and inform your health care providers. Please leave this armband in place for the full 96 hours following administration, and then you may remove the band.

## 2020-03-15 NOTE — Op Note (Signed)
Procedure(s):   Alfred Davis male 65 y.o. 03/15/2020  Preoperative diagnosis: Left shoulder rotator cuff tear after dislocation  Postoperative diagnosis: Left shoulder rotator cuff tear, irreparable Left shoulder adhesive capsulitis   Procedure(s) and Anesthesia Type:    #1 left shoulder arthroscopic extensive debridement irreparable rotator cuff tear #2 left shoulder manipulation under anesthesia #3 left shoulder intra-articular corticosteroid injection   Surgeon(s) and Role:    Jones Broom, MD - Primary     Surgeon: Berline Lopes   Assistants: Damita Lack PA-C (Danielle was present and scrubbed throughout the procedure and was essential in positioning, assisting with the camera and instrumentation,, and closure)  Anesthesia: General endotracheal anesthesia with preoperative interscalene block given by the attending anesthesiologist     Procedure Detail   Estimated Blood Loss: Min         Drains: none  Blood Given: none         Specimens: none        Complications:  * No complications entered in OR log *         Disposition: PACU - hemodynamically stable.         Condition: stable    Procedure:   INDICATIONS FOR SURGERY: The patient is 65 y.o. male who suffered a left shoulder dislocation about 4 months ago.  He was recently diagnosed with a large rotator cuff tear and noted to have significant stiffness.  He was indicated for an attempt at rotator cuff repair.  OPERATIVE FINDINGS: Examination under anesthesia: He was noted to have significant stiffness limited to 0 degrees external rotation about 60 degrees forward flexion.  After gentle manipulation under anesthesia with satisfactory lysis of adhesion was able to achieve about 165 degrees forward flexion, 30 degrees external rotation.  DESCRIPTION OF PROCEDURE: The patient was identified in preoperative  holding area where I personally marked the operative site after  verifying  site, side, and procedure with the patient. An interscalene block was given by the attending anesthesiologist the holding area.  The patient was taken back to the operating room where general anesthesia was induced without complication and was placed in the beach-chair position with the back  elevated about 60 degrees and all extremities and head and neck carefully padded and  positioned.   The shoulder was examined under anesthesia as described above and findings.  He had significant stiffness.  There was satisfactory lysis of adhesions with manipulation under anesthesia with recovery of significantly improved motion.  The left upper extremity was then prepped and  draped in a standard sterile fashion. The appropriate time-out  procedure was carried out. The patient did receive IV antibiotics  within 30 minutes of incision.   A small posterior portal incision was made and the arthroscope was introduced into the joint. An anterior portal was then established above the subscapularis using needle localization. Small cannula was placed anteriorly. Diagnostic arthroscopy was then carried out.  There was noted to be extensive synovitis in the joint consistent with adhesive capsulitis.  The subscapularis insertion was partially elevated but not torn completely.  This was lightly debrided.  The biceps tendon was intact.  The superior labrum had some degenerative tearing which was debrided back to more healthy labrum.  The biceps origin was not felt to be significantly involved.  Glenohumeral joint surfaces were intact without significant chondromalacia.  The supraspinatus was noted to be torn completely from the greater tuberosity and there was some bridging thin tissue across the top of the  tear.  The arthroscope was then introduced into the subacromial space a standard lateral portal was established with needle localization. The shaver was used through the lateral portal to perform extensive bursectomy.    The bursal surface of the rotator cuff tear was debrided.  The lateral tissue was extremely thin and had really no tendon substance.  The debridement was carried back to the mid humeral head where tendon was slightly thicker but still really of very poor quality.  The tendon was noted to be fibrotic and really not mobile.  A subacromial release was carried out without any significant improvement in mobility.  Attempting to bring the tendon back over the tuberosity was not possible and even with these attempts the tendon was so thin that it was tearing apart.  At this point I felt that the tear was not repairable.  The undersurface of the acromion was examined and I did not feel that impingement was an important part of this pathology.  Did not want to risk taking down the coracoacromial ligament with this irreparable tear and therefore no acromioplasty was performed.  An 18-gauge needle was introduced through the anterior portal into the joint under direct visualization and a combination of 1 cc 40 mg Kenalog with 7 cc half percent Marcaine without epinephrine was introduced into the joint.  The arthroscopic equipment was removed from the joint and the portals were closed with 3-0 nylon in an interrupted fashion. Sterile dressings were then applied including Xeroform 4 x 4's ABDs and tape. The patient was then allowed to awaken from general anesthesia, placed in a sling, transferred to the stretcher and taken to the recovery room in stable condition.   POSTOPERATIVE PLAN: The patient will be discharged home today and will followup in one week for suture removal and wound check.  We will get him into physical therapy right away to try and keep the motion gained with the manipulation under anesthesia.

## 2020-03-15 NOTE — Anesthesia Postprocedure Evaluation (Signed)
Anesthesia Post Note  Patient: Alfred Davis  Procedure(s) Performed: ARTHROSCOPY SHOULDER DEBRIDEMENT (Left Shoulder)     Patient location during evaluation: PACU Anesthesia Type: Regional Level of consciousness: awake and alert, oriented and patient cooperative Pain management: pain level controlled Vital Signs Assessment: post-procedure vital signs reviewed and stable Respiratory status: spontaneous breathing, nonlabored ventilation and respiratory function stable Cardiovascular status: blood pressure returned to baseline and stable Postop Assessment: no apparent nausea or vomiting Anesthetic complications: no   No complications documented.  Last Vitals:  Vitals:   03/15/20 1130 03/15/20 1257  BP:  (!) 146/84  Pulse: 92 92  Resp: 16 19  Temp:    SpO2: 98% 100%    Last Pain:  Vitals:   03/15/20 1051  TempSrc: Oral  PainSc: 0-No pain                 Lannie Fields

## 2020-03-15 NOTE — Anesthesia Preprocedure Evaluation (Addendum)
Anesthesia Evaluation  Patient identified by MRN, date of birth, ID band Patient awake    Reviewed: Allergy & Precautions, NPO status , Patient's Chart, lab work & pertinent test results  Airway Mallampati: III  TM Distance: >3 FB Neck ROM: Full    Dental no notable dental hx. (+) Teeth Intact, Dental Advisory Given   Pulmonary sleep apnea and Continuous Positive Airway Pressure Ventilation ,    Pulmonary exam normal breath sounds clear to auscultation       Cardiovascular hypertension, Pt. on medications Normal cardiovascular exam Rhythm:Regular Rate:Normal     Neuro/Psych negative neurological ROS  negative psych ROS   GI/Hepatic negative GI ROS, Neg liver ROS,   Endo/Other  negative endocrine ROS  Renal/GU negative Renal ROS     Musculoskeletal  (+) Arthritis , Osteoarthritis,    Abdominal   Peds  Hematology negative hematology ROS (+)   Anesthesia Other Findings   Reproductive/Obstetrics negative OB ROS                            Anesthesia Physical Anesthesia Plan  ASA: II  Anesthesia Plan: General and Regional   Post-op Pain Management: GA combined w/ Regional for post-op pain   Induction: Intravenous  PONV Risk Score and Plan: 2 and Ondansetron, Dexamethasone, Midazolam and Treatment may vary due to age or medical condition  Airway Management Planned: Oral ETT  Additional Equipment: None  Intra-op Plan:   Post-operative Plan: Extubation in OR  Informed Consent: I have reviewed the patients History and Physical, chart, labs and discussed the procedure including the risks, benefits and alternatives for the proposed anesthesia with the patient or authorized representative who has indicated his/her understanding and acceptance.     Dental advisory given  Plan Discussed with: CRNA  Anesthesia Plan Comments:        Anesthesia Quick Evaluation

## 2020-03-16 ENCOUNTER — Encounter (HOSPITAL_BASED_OUTPATIENT_CLINIC_OR_DEPARTMENT_OTHER): Payer: Self-pay | Admitting: Orthopedic Surgery

## 2020-05-14 ENCOUNTER — Ambulatory Visit: Payer: 59

## 2020-09-28 DIAGNOSIS — G4733 Obstructive sleep apnea (adult) (pediatric): Secondary | ICD-10-CM | POA: Diagnosis not present

## 2020-10-22 DIAGNOSIS — G4733 Obstructive sleep apnea (adult) (pediatric): Secondary | ICD-10-CM | POA: Diagnosis not present

## 2020-11-22 DIAGNOSIS — G4733 Obstructive sleep apnea (adult) (pediatric): Secondary | ICD-10-CM | POA: Diagnosis not present

## 2020-12-22 DIAGNOSIS — G4733 Obstructive sleep apnea (adult) (pediatric): Secondary | ICD-10-CM | POA: Diagnosis not present

## 2020-12-27 DIAGNOSIS — U071 COVID-19: Secondary | ICD-10-CM | POA: Diagnosis not present

## 2020-12-30 DIAGNOSIS — G4733 Obstructive sleep apnea (adult) (pediatric): Secondary | ICD-10-CM | POA: Diagnosis not present

## 2021-01-31 DIAGNOSIS — R7309 Other abnormal glucose: Secondary | ICD-10-CM | POA: Diagnosis not present

## 2021-01-31 DIAGNOSIS — I10 Essential (primary) hypertension: Secondary | ICD-10-CM | POA: Diagnosis not present

## 2021-01-31 DIAGNOSIS — M25552 Pain in left hip: Secondary | ICD-10-CM | POA: Diagnosis not present

## 2021-01-31 DIAGNOSIS — J309 Allergic rhinitis, unspecified: Secondary | ICD-10-CM | POA: Diagnosis not present

## 2021-01-31 DIAGNOSIS — E7800 Pure hypercholesterolemia, unspecified: Secondary | ICD-10-CM | POA: Diagnosis not present

## 2021-01-31 DIAGNOSIS — Z1389 Encounter for screening for other disorder: Secondary | ICD-10-CM | POA: Diagnosis not present

## 2021-01-31 DIAGNOSIS — Z6839 Body mass index (BMI) 39.0-39.9, adult: Secondary | ICD-10-CM | POA: Diagnosis not present

## 2021-01-31 DIAGNOSIS — R7303 Prediabetes: Secondary | ICD-10-CM | POA: Diagnosis not present

## 2021-01-31 DIAGNOSIS — K219 Gastro-esophageal reflux disease without esophagitis: Secondary | ICD-10-CM | POA: Diagnosis not present

## 2021-02-01 ENCOUNTER — Ambulatory Visit
Admission: RE | Admit: 2021-02-01 | Discharge: 2021-02-01 | Disposition: A | Discharge status: Home / Self Care | Payer: Self-pay | Source: Ambulatory Visit | Attending: Family Medicine | Admitting: Family Medicine

## 2021-02-01 ENCOUNTER — Other Ambulatory Visit: Payer: Self-pay | Admitting: Family Medicine

## 2021-02-01 DIAGNOSIS — M25552 Pain in left hip: Secondary | ICD-10-CM | POA: Diagnosis not present

## 2021-02-14 DIAGNOSIS — M25552 Pain in left hip: Secondary | ICD-10-CM | POA: Diagnosis not present

## 2021-02-14 DIAGNOSIS — M545 Low back pain, unspecified: Secondary | ICD-10-CM | POA: Diagnosis not present

## 2021-03-16 DIAGNOSIS — G4733 Obstructive sleep apnea (adult) (pediatric): Secondary | ICD-10-CM | POA: Diagnosis not present

## 2021-03-21 DIAGNOSIS — M5416 Radiculopathy, lumbar region: Secondary | ICD-10-CM | POA: Diagnosis not present

## 2021-04-11 DIAGNOSIS — M5451 Vertebrogenic low back pain: Secondary | ICD-10-CM | POA: Diagnosis not present

## 2021-04-19 DIAGNOSIS — H1789 Other corneal scars and opacities: Secondary | ICD-10-CM | POA: Diagnosis not present

## 2021-04-19 DIAGNOSIS — H2513 Age-related nuclear cataract, bilateral: Secondary | ICD-10-CM | POA: Diagnosis not present

## 2021-04-19 DIAGNOSIS — H52203 Unspecified astigmatism, bilateral: Secondary | ICD-10-CM | POA: Diagnosis not present

## 2021-04-19 DIAGNOSIS — H02831 Dermatochalasis of right upper eyelid: Secondary | ICD-10-CM | POA: Diagnosis not present

## 2021-07-26 DIAGNOSIS — K219 Gastro-esophageal reflux disease without esophagitis: Secondary | ICD-10-CM | POA: Diagnosis not present

## 2021-07-26 DIAGNOSIS — Z0000 Encounter for general adult medical examination without abnormal findings: Secondary | ICD-10-CM | POA: Diagnosis not present

## 2021-07-26 DIAGNOSIS — E7800 Pure hypercholesterolemia, unspecified: Secondary | ICD-10-CM | POA: Diagnosis not present

## 2021-07-26 DIAGNOSIS — I10 Essential (primary) hypertension: Secondary | ICD-10-CM | POA: Diagnosis not present

## 2021-07-26 DIAGNOSIS — Z125 Encounter for screening for malignant neoplasm of prostate: Secondary | ICD-10-CM | POA: Diagnosis not present

## 2021-07-26 DIAGNOSIS — Z23 Encounter for immunization: Secondary | ICD-10-CM | POA: Diagnosis not present

## 2021-07-26 DIAGNOSIS — R7303 Prediabetes: Secondary | ICD-10-CM | POA: Diagnosis not present

## 2021-07-26 DIAGNOSIS — J309 Allergic rhinitis, unspecified: Secondary | ICD-10-CM | POA: Diagnosis not present

## 2021-08-19 DIAGNOSIS — R748 Abnormal levels of other serum enzymes: Secondary | ICD-10-CM | POA: Diagnosis not present

## 2021-10-22 DIAGNOSIS — G4733 Obstructive sleep apnea (adult) (pediatric): Secondary | ICD-10-CM | POA: Diagnosis not present

## 2021-10-26 DIAGNOSIS — Z23 Encounter for immunization: Secondary | ICD-10-CM | POA: Diagnosis not present

## 2021-10-26 DIAGNOSIS — R748 Abnormal levels of other serum enzymes: Secondary | ICD-10-CM | POA: Diagnosis not present

## 2022-01-27 DIAGNOSIS — E7800 Pure hypercholesterolemia, unspecified: Secondary | ICD-10-CM | POA: Diagnosis not present

## 2022-01-27 DIAGNOSIS — R7303 Prediabetes: Secondary | ICD-10-CM | POA: Diagnosis not present

## 2022-01-27 DIAGNOSIS — J309 Allergic rhinitis, unspecified: Secondary | ICD-10-CM | POA: Diagnosis not present

## 2022-01-27 DIAGNOSIS — K219 Gastro-esophageal reflux disease without esophagitis: Secondary | ICD-10-CM | POA: Diagnosis not present

## 2022-01-27 DIAGNOSIS — I10 Essential (primary) hypertension: Secondary | ICD-10-CM | POA: Diagnosis not present

## 2022-01-27 DIAGNOSIS — L989 Disorder of the skin and subcutaneous tissue, unspecified: Secondary | ICD-10-CM | POA: Diagnosis not present

## 2022-03-10 DIAGNOSIS — L280 Lichen simplex chronicus: Secondary | ICD-10-CM | POA: Diagnosis not present

## 2022-03-10 DIAGNOSIS — L308 Other specified dermatitis: Secondary | ICD-10-CM | POA: Diagnosis not present

## 2022-03-16 DIAGNOSIS — G4733 Obstructive sleep apnea (adult) (pediatric): Secondary | ICD-10-CM | POA: Diagnosis not present

## 2022-04-04 IMAGING — CR DG HIP (WITH OR WITHOUT PELVIS) 2-3V*L*
2 series · 2 of 2 positions shown · non-contrast
Comparison: None.

CLINICAL DATA: Left hip pain

EXAM:
DG HIP (WITH OR WITHOUT PELVIS) 2V LEFT

[w hip ap left]
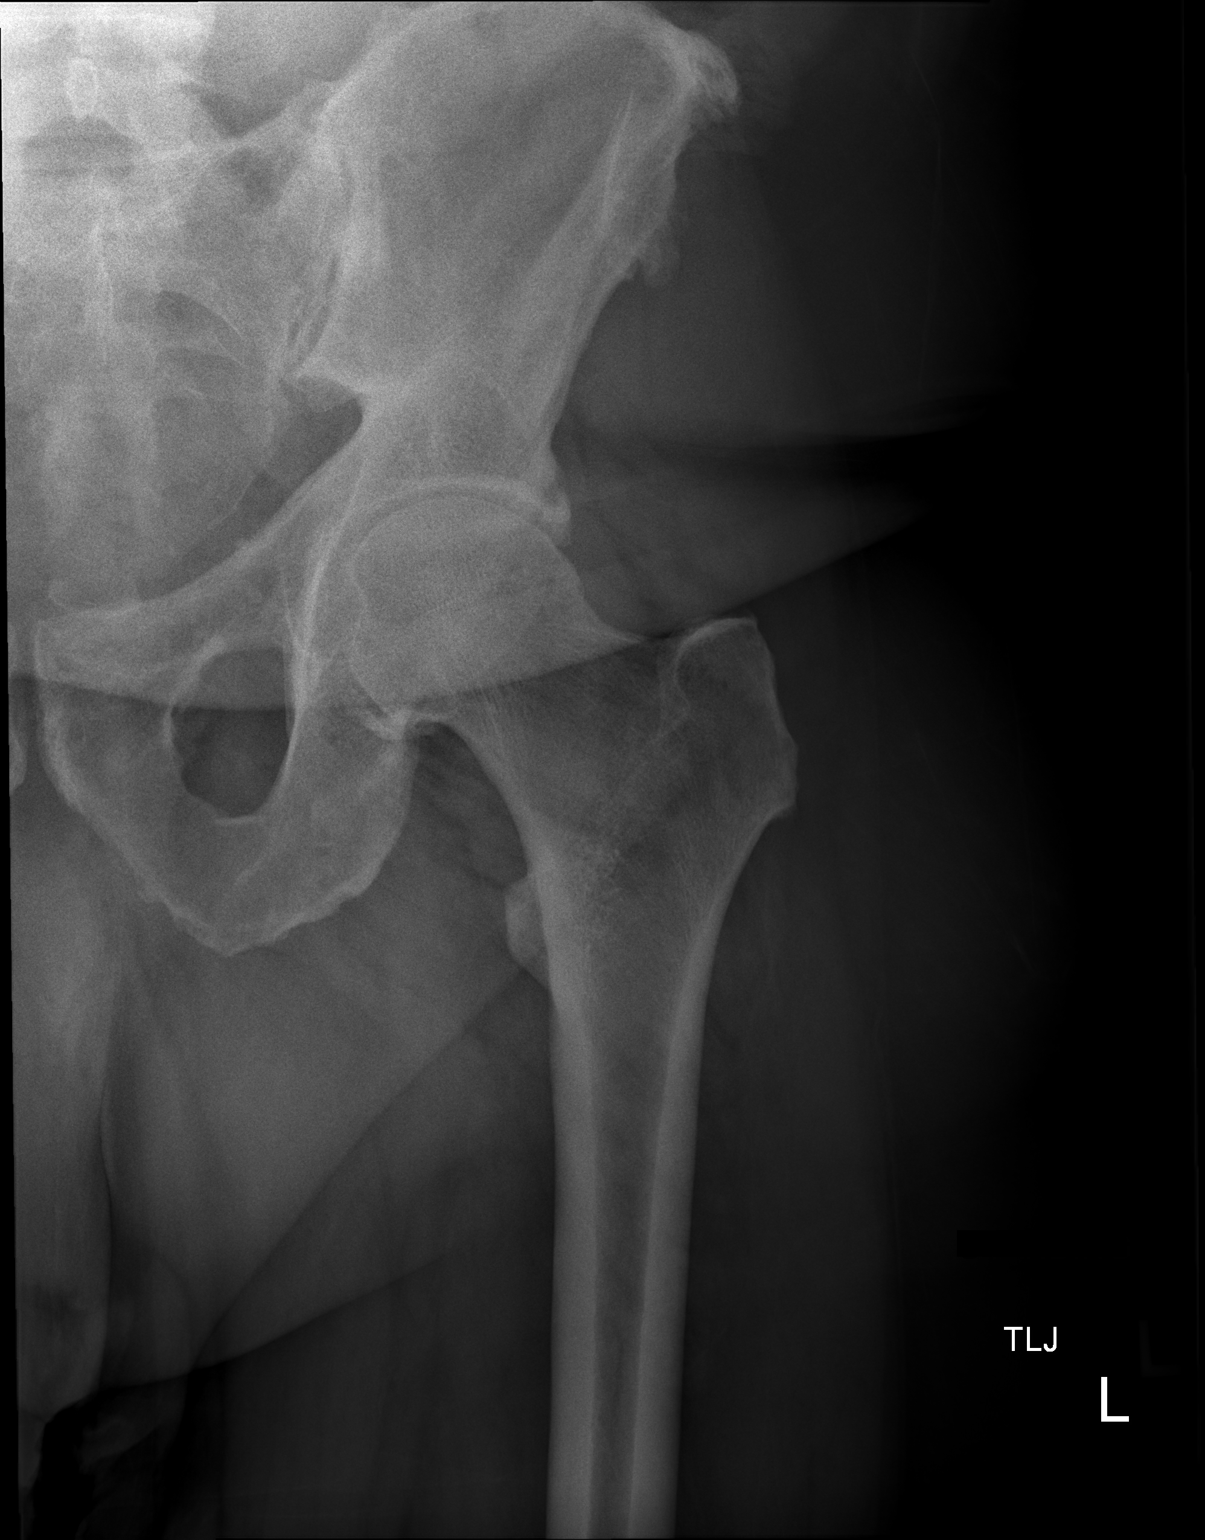

[w hip lat left]
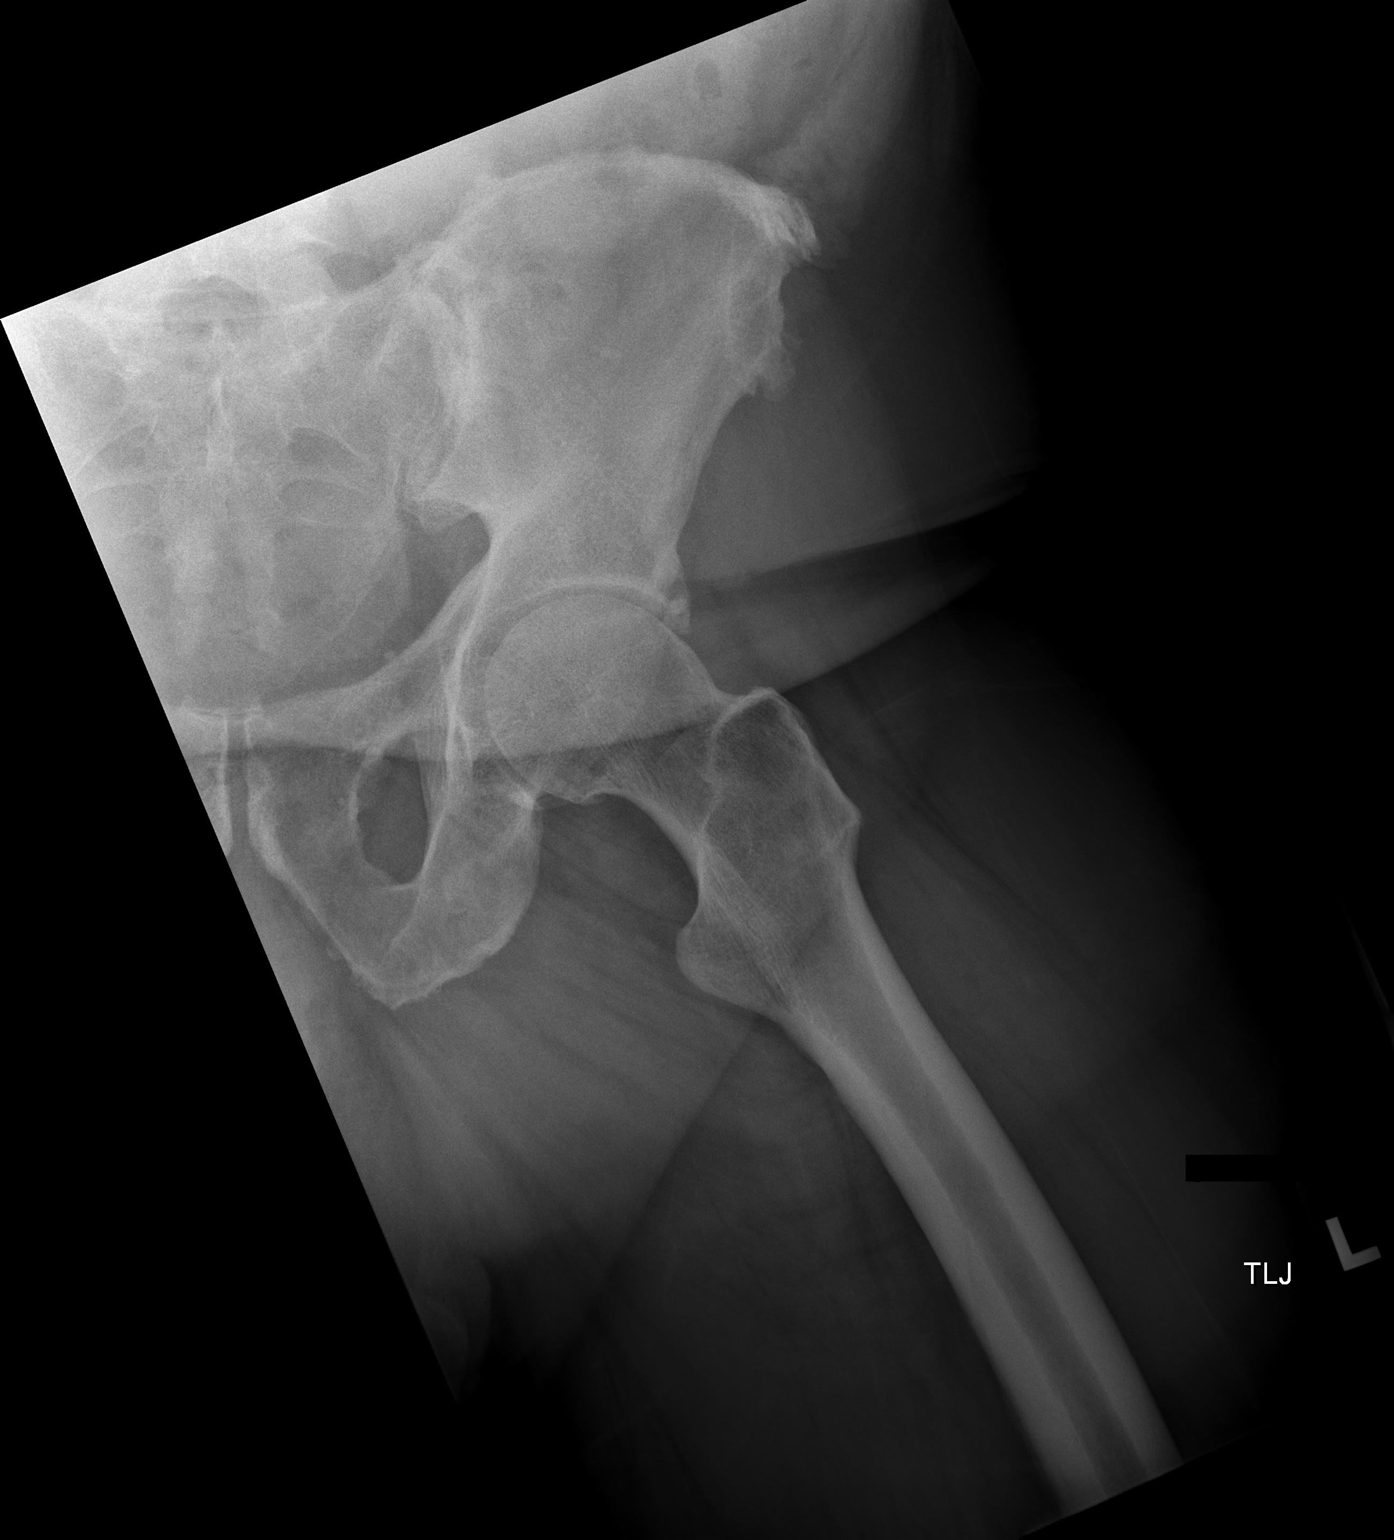

[2 of 2 positions shown; findings below may reference images not displayed]

FINDINGS: Visualized pelvic ring is intact. Degenerative changes of the hip
joint are seen. No acute fracture or dislocation is noted. No soft
tissue changes are noted.
IMPRESSION: Degenerative change without acute abnormality.

## 2022-04-14 DIAGNOSIS — L985 Mucinosis of the skin: Secondary | ICD-10-CM | POA: Diagnosis not present

## 2022-04-14 DIAGNOSIS — B078 Other viral warts: Secondary | ICD-10-CM | POA: Diagnosis not present

## 2022-04-25 DIAGNOSIS — H02831 Dermatochalasis of right upper eyelid: Secondary | ICD-10-CM | POA: Diagnosis not present

## 2022-04-25 DIAGNOSIS — H02834 Dermatochalasis of left upper eyelid: Secondary | ICD-10-CM | POA: Diagnosis not present

## 2022-10-18 DIAGNOSIS — E7800 Pure hypercholesterolemia, unspecified: Secondary | ICD-10-CM | POA: Diagnosis not present

## 2022-10-18 DIAGNOSIS — G4733 Obstructive sleep apnea (adult) (pediatric): Secondary | ICD-10-CM | POA: Diagnosis not present

## 2022-10-18 DIAGNOSIS — R053 Chronic cough: Secondary | ICD-10-CM | POA: Diagnosis not present

## 2022-10-18 DIAGNOSIS — Z1331 Encounter for screening for depression: Secondary | ICD-10-CM | POA: Diagnosis not present

## 2022-10-18 DIAGNOSIS — I10 Essential (primary) hypertension: Secondary | ICD-10-CM | POA: Diagnosis not present

## 2022-10-18 DIAGNOSIS — Z86010 Personal history of colonic polyps: Secondary | ICD-10-CM | POA: Diagnosis not present

## 2022-10-18 DIAGNOSIS — Z0000 Encounter for general adult medical examination without abnormal findings: Secondary | ICD-10-CM | POA: Diagnosis not present

## 2022-10-18 DIAGNOSIS — R7303 Prediabetes: Secondary | ICD-10-CM | POA: Diagnosis not present

## 2023-02-03 DIAGNOSIS — R051 Acute cough: Secondary | ICD-10-CM | POA: Diagnosis not present

## 2023-02-03 DIAGNOSIS — J111 Influenza due to unidentified influenza virus with other respiratory manifestations: Secondary | ICD-10-CM | POA: Diagnosis not present

## 2023-02-03 DIAGNOSIS — R509 Fever, unspecified: Secondary | ICD-10-CM | POA: Diagnosis not present

## 2023-03-20 DIAGNOSIS — G4733 Obstructive sleep apnea (adult) (pediatric): Secondary | ICD-10-CM | POA: Diagnosis not present

## 2023-04-20 DIAGNOSIS — K219 Gastro-esophageal reflux disease without esophagitis: Secondary | ICD-10-CM | POA: Diagnosis not present

## 2023-04-20 DIAGNOSIS — E7800 Pure hypercholesterolemia, unspecified: Secondary | ICD-10-CM | POA: Diagnosis not present

## 2023-04-20 DIAGNOSIS — Z23 Encounter for immunization: Secondary | ICD-10-CM | POA: Diagnosis not present

## 2023-04-20 DIAGNOSIS — R053 Chronic cough: Secondary | ICD-10-CM | POA: Diagnosis not present

## 2023-04-20 DIAGNOSIS — I10 Essential (primary) hypertension: Secondary | ICD-10-CM | POA: Diagnosis not present

## 2023-04-20 DIAGNOSIS — G4733 Obstructive sleep apnea (adult) (pediatric): Secondary | ICD-10-CM | POA: Diagnosis not present

## 2023-04-20 DIAGNOSIS — J309 Allergic rhinitis, unspecified: Secondary | ICD-10-CM | POA: Diagnosis not present

## 2023-04-20 DIAGNOSIS — R7303 Prediabetes: Secondary | ICD-10-CM | POA: Diagnosis not present

## 2023-05-18 DIAGNOSIS — R053 Chronic cough: Secondary | ICD-10-CM | POA: Diagnosis not present

## 2023-05-18 DIAGNOSIS — I10 Essential (primary) hypertension: Secondary | ICD-10-CM | POA: Diagnosis not present

## 2023-07-08 NOTE — Progress Notes (Unsigned)
 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     History of Present Illness  07/11/2023  Pulmonary/ 1st office eval/Steffen Hase on zyrtec  Chief Complaint  Patient presents with   Follow-up    Consult for chronic cough   Dyspnea:  Not limited by breathing from desired activities   Cough: worse w/in 5 min of standing in am > min productive clear thin x 15 min worse p naps and meals  Sleep: bed is flat/ one big pillow / cpap  SABA use: did not help in past  02 [id number]    No obvious day to day or daytime pattern/variability or assoc  mucus plugs or hemoptysis or cp or chest tightness, subjective wheeze or overt sinus or hb symptoms.    Also denies any obvious fluctuation of symptoms with weather or environmental changes or other aggravating or alleviating factors except as outlined above   No unusual exposure hx or h/o childhood pna/ asthma or knowledge of premature birth.  Current Allergies, Complete Past Medical History, Past Surgical History, Family History, and Social History were reviewed in [employer] record.  ROS  The following are not active complaints unless bolded Hoarseness, sore throat, dysphagia, dental problems, itching, sneezing,  nasal congestion or discharge of excess mucus or purulent secretions, ear ache,   fever, chills, sweats, unintended wt loss or wt gain, classically pleuritic or exertional cp,  orthopnea pnd or arm/hand swelling  or leg swelling, presyncope, palpitations, abdominal pain, anorexia, nausea, vomiting, diarrhea  or change in bowel  habits or change in bladder habits, change in stools or change in urine, dysuria, hematuria,  rash, arthralgias, visual complaints, headache, numbness, weakness or ataxia or problems with walking or coordination,  change in mood or  memory.             Outpatient Medications Prior to Visit  Medication Sig Dispense Refill   aspirin EC 81 MG tablet Take 81 mg by mouth daily. Swallow whole.     cetirizine (ZYRTEC) 10 MG tablet Take 10 mg by mouth daily.     famotidine (PEPCID) 40 MG tablet Take 40 mg by mouth daily.     rosuvastatin (CRESTOR) 5 MG tablet Take 5 mg by mouth daily.     valsartan-hydrochlorothiazide (DIOVAN-HCT) 320-12.5 MG tablet Take 1 tablet by mouth daily.     oxyCODONE -acetaminophen  (PERCOCET) 5-325 MG tablet Take 1-2 tablets every 4 hours as needed for post operative pain. MAX 6/day (Patient not taking: Reported on 07/11/2023) 30 tablet 0   tiZANidine  (ZANAFLEX ) 4 MG tablet Take 1 tablet (4 mg total) by mouth every 8 (eight) hours as needed for muscle spasms. (Patient not taking: Reported on 07/11/2023) 30 tablet 1   No facility-administered medications prior to visit.    Past Medical History:  Diagnosis Date   Arthritis    Environmental and seasonal allergies    Hypertension    Left rotator cuff tear    Sleep apnea    uses CPAP nightly  Objective:     BP 119/66 (BP Location: Left Arm, Patient Position: Sitting, Cuff Size: Large)   Pulse 64   Temp (!) 97.5 F (36.4 C) (Temporal)   Ht 5\' 10"  (1.778 m)   Wt 253 lb 8 oz (115 kg)   SpO2 96%   BMI 36.37 kg/m   SpO2: 96 %  RA   Amb somber wm nad/ no spont cough  Mod te/ watery d/c   HEENT : Oropharynx  clear     Nasal turbinates severe turbinate edema / watery discharge   NECK :  without  apparent JVD/ palpable Nodes/TM    LUNGS: no acc muscle use,  Nl contour chest which is clear to A and P bilaterally without cough on insp or exp maneuvers   CV:  RRR  no s3 or murmur or increase in P2, and no  edema   ABD:  soft and nontender   MS:  Gait nl   ext warm without deformities Or obvious joint restrictions  calf tenderness, cyanosis or clubbing    SKIN: warm and dry without lesions    NEURO:  alert, approp, nl sensorium with  no motor or cerebellar deficits apparent.    CXR PA and Lateral:   07/11/2023 :    I personally reviewed images and impression is as follows:     L HD slt elevated same as 11/06/2018 / no source for cough identified       Assessment   Upper airway cough syndrome Onset prior to covid in 2020  - Allergy screen 07/11/2023 >  Eos 0. /  IgE   - 07/11/2023 trial of depomedrol/ max gerd rx and  1st gen H1 blockers per guidelines    Assoc with significant rhinitis c/w Upper airway cough syndrome (previously labeled PNDS),  is so named because it's frequently impossible to sort out how much is  CR/sinusitis with freq throat clearing (which can be related to primary GERD)   vs  causing  secondary (" extra esophageal")  GERD from wide swings in gastric pressure that occur with throat clearing, often  promoting self use of mint and menthol lozenges that reduce the lower esophageal sphincter tone and exacerbate the problem further in a cyclical fashion.   These are the same pts (now being labeled as having "irritable larynx syndrome" by some cough centers) who not infrequently have a history of having failed to tolerate ace inhibitors,  dry powder inhalers or biphosphonates or report having atypical/extraesophageal reflux symptoms(Prob LPR in this case) that don't respond to standard doses of PPI  and are easily confused as having aecopd or asthma flares by even experienced allergists/ pulmonologists (myself included).   Of the three most common causes of  Sub-acute / recurrent or chronic cough, only one (GERD)  can actually contribute to/ trigger  the other two (asthma and post nasal drip syndrome)  and perpetuate the cylce of cough.  While not intuitively obvious, many  patients with chronic low grade reflux do not cough until there is a primary insult that disturbs the protective epithelial barrier and exposes sensitive nerve endings.   This is typically viral but can due to PNDS and  either may apply here.   The point is that once this occurs, it is difficult to eliminate the cycle  using anything but a maximally effective acid suppression regimen at least in the short run, accompanied by an appropriate diet to address non acid GERD and control / eliminate the cough  with hard rock candy and tessalon 200 mg >>> also added depomedrol 120 mg IM  in case of component of Th-2 driven upper or lower airways inflammation (if cough responds short term only to relapse before return while will on full rx for uacs (as above), then that would point to allergic rhinitis/ asthma or eos bronchitis as alternative dx)    Discussed in detail all the  indications, usual  risks and alternatives  relative to the benefits with patient who agrees to proceed with Rx as outlined.             Each maintenance medication was reviewed in detail including emphasizing most importantly the difference between maintenance and prns and under what circumstances the prns are to be triggered using an action plan format where appropriate.  Total time for H and P, chart review, counseling,  and generating customized AVS unique to this office visit / same day charting = 60 min new pt eval  with chronic  refractory respiratory  symptoms of uncertain etiology            Vernestine Gondola, MD 07/11/2023

## 2023-07-11 ENCOUNTER — Encounter: Payer: Self-pay | Admitting: Internal Medicine

## 2023-07-11 ENCOUNTER — Ambulatory Visit (INDEPENDENT_AMBULATORY_CARE_PROVIDER_SITE_OTHER)

## 2023-07-11 ENCOUNTER — Ambulatory Visit: Admitting: Internal Medicine

## 2023-07-11 VITALS — BP 119/66 | HR 64 | Temp 97.5°F | Ht 70.0 in | Wt 253.5 lb

## 2023-07-11 DIAGNOSIS — R058 Other specified cough: Secondary | ICD-10-CM | POA: Diagnosis not present

## 2023-07-11 DIAGNOSIS — R053 Chronic cough: Secondary | ICD-10-CM | POA: Diagnosis not present

## 2023-07-11 DIAGNOSIS — J9811 Atelectasis: Secondary | ICD-10-CM | POA: Diagnosis not present

## 2023-07-11 LAB — CBC WITH DIFFERENTIAL/PLATELET
Basophils Absolute: 0.1 10*3/uL (ref 0.0–0.1)
Basophils Relative: 1.2 % (ref 0.0–3.0)
Eosinophils Absolute: 0.2 10*3/uL (ref 0.0–0.7)
Eosinophils Relative: 3.9 % (ref 0.0–5.0)
HCT: 42.3 % (ref 39.0–52.0)
Hemoglobin: 14.2 g/dL (ref 13.0–17.0)
Lymphocytes Relative: 22.5 % (ref 12.0–46.0)
Lymphs Abs: 1.4 10*3/uL (ref 0.7–4.0)
MCHC: 33.6 g/dL (ref 30.0–36.0)
MCV: 92.7 fl (ref 78.0–100.0)
Monocytes Absolute: 0.6 10*3/uL (ref 0.1–1.0)
Monocytes Relative: 9.9 % (ref 3.0–12.0)
Neutro Abs: 3.9 10*3/uL (ref 1.4–7.7)
Neutrophils Relative %: 62.5 % (ref 43.0–77.0)
Platelets: 212 10*3/uL (ref 150.0–400.0)
RBC: 4.57 Mil/uL (ref 4.22–5.81)
RDW: 13.2 % (ref 11.5–15.5)
WBC: 6.3 10*3/uL (ref 4.0–10.5)

## 2023-07-11 MED ORDER — BENZONATATE 200 MG PO CAPS
200.0000 mg | ORAL_CAPSULE | Freq: Three times a day (TID) | ORAL | 1 refills | Status: DC | PRN
Start: 2023-07-11 — End: 2023-12-24

## 2023-07-11 MED ORDER — METHYLPREDNISOLONE ACETATE 80 MG/ML IJ SUSP
120.0000 mg | Freq: Once | INTRAMUSCULAR | Status: AC
  Administered 2023-07-11: 120 mg via INTRAMUSCULAR

## 2023-07-11 MED ORDER — PANTOPRAZOLE SODIUM 40 MG PO TBEC: 40.0000 mg | DELAYED_RELEASE_TABLET | Freq: Every day | ORAL | 2 refills | Status: DC

## 2023-07-11 MED ORDER — FAMOTIDINE 20 MG PO TABS: ORAL_TABLET | ORAL | 11 refills | Status: DC

## 2023-07-11 NOTE — Assessment & Plan Note (Signed)
 Onset prior to covid in 2020  - Allergy screen 07/11/2023 >  Eos 0. /  IgE   - 07/11/2023 trial of depomedrol/ max gerd rx and  1st gen H1 blockers per guidelines    Assoc with significant rhinitis c/w Upper airway cough syndrome (previously labeled PNDS),  is so named because it's frequently impossible to sort out how much is  CR/sinusitis with freq throat clearing (which can be related to primary GERD)   vs  causing  secondary (" extra esophageal")  GERD from wide swings in gastric pressure that occur with throat clearing, often  promoting self use of mint and menthol lozenges that reduce the lower esophageal sphincter tone and exacerbate the problem further in a cyclical fashion.   These are the same pts (now being labeled as having "irritable larynx syndrome" by some cough centers) who not infrequently have a history of having failed to tolerate ace inhibitors,  dry powder inhalers or biphosphonates or report having atypical/extraesophageal reflux symptoms(Prob LPR in this case) that don't respond to standard doses of PPI  and are easily confused as having aecopd or asthma flares by even experienced allergists/ pulmonologists (myself included).   Of the three most common causes of  Sub-acute / recurrent or chronic cough, only one (GERD)  can actually contribute to/ trigger  the other two (asthma and post nasal drip syndrome)  and perpetuate the cylce of cough.  While not intuitively obvious, many patients with chronic low grade reflux do not cough until there is a primary insult that disturbs the protective epithelial barrier and exposes sensitive nerve endings.   This is typically viral but can due to PNDS and  either may apply here.   The point is that once this occurs, it is difficult to eliminate the cycle  using anything but a maximally effective acid suppression regimen at least in the short run, accompanied by an appropriate diet to address non acid GERD and control / eliminate the cough  with  hard rock candy and tessalon 200 mg >>> also added depomedrol 120 mg IM  in case of component of Th-2 driven upper or lower airways inflammation (if cough responds short term only to relapse before return while will on full rx for uacs (as above), then that would point to allergic rhinitis/ asthma or eos bronchitis as alternative dx)    Discussed in detail all the  indications, usual  risks and alternatives  relative to the benefits with patient who agrees to proceed with Rx as outlined.             Each maintenance medication was reviewed in detail including emphasizing most importantly the difference between maintenance and prns and under what circumstances the prns are to be triggered using an action plan format where appropriate.  Total time for H and P, chart review, counseling,  and generating customized AVS unique to this office visit / same day charting = 60 min new pt eval  with chronic  refractory respiratory  symptoms of uncertain etiology

## 2023-07-11 NOTE — Patient Instructions (Addendum)
 Depomedrol 120 mg IM today   Pantoprazole (protonix) 40 mg   Take  30-60 min before first meal of the day and Pepcid (famotidine)  20 mg  at least an hour before bed with your chlorpheniramine  GERD (REFLUX)  is an extremely common cause of respiratory symptoms just like yours , many times with no obvious heartburn at all.    It can be treated with medication, but also with lifestyle changes including elevation of the head of your bed (ideally with 6 -8inch blocks under the headboard of your bed),  Smoking cessation, avoidance of late meals, excessive alcohol, and avoid fatty foods, chocolate, peppermint, colas, red wine, and acidic juices such as orange juice.  NO MINT OR MENTHOL PRODUCTS SO NO COUGH DROPS - Luden's ok  USE SUGARLESS CANDY INSTEAD (Jolley ranchers or Stover's or Life Savers) or even ice chips will also do - the key is to swallow to prevent all throat clearing. NO OIL BASED VITAMINS - use powdered substitutes.  Avoid fish oil when coughing.  For cough/ throat clearing > tessalon 200 mg up to 4 hours as needed   Change zyrtec to take in am and  one hour before bedtime For cough/ drainage / throat tickle try take over the counter CHLORPHENIRAMINE  4 mg   take one every 4 hours as needed - extremely effective and inexpensive over the counter- may cause drowsiness so start with just a dose or two an hour before bedtime    Please remember to go to the  x-ray department  @  [facility] for your tests - we will call you with the results when they are available     Please remember to go to the lab department   for your tests - we will call you with the results when they are available.       Follow up is in a month if not better.

## 2023-07-12 ENCOUNTER — Ambulatory Visit: Payer: Self-pay | Admitting: Internal Medicine

## 2023-07-12 LAB — IGE: IgE (Immunoglobulin E), Serum: 566 kU/L — ABNORMAL HIGH (ref <=114)

## 2023-07-17 ENCOUNTER — Telehealth: Payer: Self-pay | Admitting: *Deleted

## 2023-07-17 DIAGNOSIS — R058 Other specified cough: Secondary | ICD-10-CM

## 2023-07-17 NOTE — Telephone Encounter (Signed)
 I called and spoke with the pt to relay his cxr results  While speaking with him, he mentioned that he has changed his mind and wants to proceed with allergy referral  Referral placed   See lab result note:   Wilhemena Harbour, [facility] 07/13/2023  1:29 PM EDT Back to Top    Spoke with patient regarding results and recommendations. Patient voiced understanding. Patient declined referral at this time and wants to discuss further with Dr. Waymond Hailey at next [facility]. No further questions or concerns.   Diamond Formica, MD 07/12/2023  6:12 PM EDT     Call patient :  Studies are suggestive of a significant allergy so if not doing some better by now can refer or we can wait until returns for f/u to regroup/discuss options

## 2023-07-17 NOTE — Progress Notes (Signed)
Spoke with pt and notified of results per Dr. Wert. Pt verbalized understanding and denied any questions. 

## 2023-07-17 NOTE — Telephone Encounter (Signed)
 Copied from CRM [id number]. Topic: General - Call Back - No Documentation >> Jul 13, 2023 12:10 PM Isabell A wrote: Reason for CRM: Patient returning missed phone call from [place].   From result note,pt is aware,nfn

## 2023-08-15 ENCOUNTER — Encounter: Payer: Self-pay | Admitting: Allergy

## 2023-08-15 ENCOUNTER — Other Ambulatory Visit: Payer: Self-pay

## 2023-08-15 ENCOUNTER — Ambulatory Visit (INDEPENDENT_AMBULATORY_CARE_PROVIDER_SITE_OTHER): Payer: Self-pay | Admitting: Allergy

## 2023-08-15 VITALS — BP 124/78 | HR 60 | Resp 18 | Ht 68.5 in | Wt 256.1 lb

## 2023-08-15 DIAGNOSIS — J309 Allergic rhinitis, unspecified: Secondary | ICD-10-CM | POA: Insufficient documentation

## 2023-08-15 DIAGNOSIS — J4540 Moderate persistent asthma, uncomplicated: Secondary | ICD-10-CM | POA: Diagnosis not present

## 2023-08-15 DIAGNOSIS — Z91038 Other insect allergy status: Secondary | ICD-10-CM

## 2023-08-15 DIAGNOSIS — R053 Chronic cough: Secondary | ICD-10-CM

## 2023-08-15 DIAGNOSIS — G4733 Obstructive sleep apnea (adult) (pediatric): Secondary | ICD-10-CM | POA: Insufficient documentation

## 2023-08-15 DIAGNOSIS — I10 Essential (primary) hypertension: Secondary | ICD-10-CM | POA: Insufficient documentation

## 2023-08-15 DIAGNOSIS — E7800 Pure hypercholesterolemia, unspecified: Secondary | ICD-10-CM | POA: Insufficient documentation

## 2023-08-15 DIAGNOSIS — J3089 Other allergic rhinitis: Secondary | ICD-10-CM | POA: Diagnosis not present

## 2023-08-15 DIAGNOSIS — R7303 Prediabetes: Secondary | ICD-10-CM | POA: Insufficient documentation

## 2023-08-15 DIAGNOSIS — J45998 Other asthma: Secondary | ICD-10-CM | POA: Diagnosis not present

## 2023-08-15 DIAGNOSIS — J45909 Unspecified asthma, uncomplicated: Secondary | ICD-10-CM | POA: Diagnosis not present

## 2023-08-15 DIAGNOSIS — K219 Gastro-esophageal reflux disease without esophagitis: Secondary | ICD-10-CM

## 2023-08-15 MED ORDER — ALBUTEROL SULFATE HFA 108 (90 BASE) MCG/ACT IN AERS: 2.0000 | INHALATION_SPRAY | RESPIRATORY_TRACT | 1 refills | Status: AC | PRN

## 2023-08-15 MED ORDER — AZELASTINE HCL 0.1 % NA SOLN: 1.0000 | Freq: Two times a day (BID) | NASAL | 5 refills | Status: AC | PRN

## 2023-08-15 MED ORDER — BUDESONIDE-FORMOTEROL FUMARATE 80-4.5 MCG/ACT IN AERO: 2.0000 | INHALATION_SPRAY | Freq: Two times a day (BID) | RESPIRATORY_TRACT | 3 refills | Status: DC

## 2023-08-15 NOTE — Patient Instructions (Signed)
 Coughing/asthma Coughing most likely multifactorial but you do seem to have a component of asthma.  Daily controller medication(s): start Symbicort 80mcg 2 puffs twice a day with spacer and rinse mouth afterwards. Spacer given and demonstrated proper use with inhaler. Patient understood technique and all questions/concerned were addressed.   May use albuterol rescue inhaler 2 puffs every 4 to 6 hours as needed for shortness of breath, chest tightness, coughing, and wheezing. May use albuterol rescue inhaler 2 puffs 5 to 15 minutes prior to strenuous physical activities. Monitor frequency of use - if you need to use it more than twice per week on a consistent basis let us  know.  Breathing control goals:  Full participation in all desired activities (may need albuterol before activity) Albuterol use two times or less a week on average (not counting use with activity) Cough interfering with sleep two times or less a month Oral steroids no more than once a year No hospitalizations   Reflux See handout for lifestyle and dietary modifications. Continue pantoprazole  40mg  once day - nothing to eat or drink for 20-30 minutes afterwards.   Rhinitis  Return for allergy skin testing. Will make additional recommendations based on results. Make sure you don't take any antihistamines for 3 days before the skin testing appointment. Don't put any lotion on the back and arms on the day of testing.  Plan on being here for 30-60 minutes.  Stop 3 days before the skin testing.  Use over the counter antihistamines such as Zyrtec (cetirizine), Claritin (loratadine), Allegra (fexofenadine), or Xyzal (levocetirizine) daily as needed. May take twice a day during allergy flares. May switch antihistamines every few months. Use azelastine nasal spray 1-2 sprays per nostril twice a day as needed for runny nose/drainage. Nasal saline spray (i.e., Simply Saline) or nasal saline lavage (i.e., NeilMed) is recommended as  needed and prior to medicated nasal sprays.  Follow up for skin testing in 1 month.

## 2023-08-15 NOTE — Progress Notes (Signed)
 New Patient Note  RE: Alfred Davis MRN: [id number] DOB: Jan 08, 1956 Date of Office Visit: 08/15/2023  Consult requested by: Diamond Formica, MD Primary care provider: Vidal Graven, MD  Chief Complaint: Establish Care (Pt reports sinus congestion, cough x years. PCP tried to see if the pt's bp meds were the cause of the cough and determined that it was not the case.)  History of Present Illness: I had the pleasure of seeing Alfred Davis for initial evaluation at the [facility] on 08/15/2023. He is a 68 y.o. male, who is referred here by Vidal Graven, MD for the evaluation of sinus issues and cough.  Discussed the use of AI scribe software for clinical note transcription with the patient, who gave verbal consent to proceed.    He has experienced a chronic cough for over ten years, which is occasionally productive of clear, thin phlegm but is mostly dry. The cough does not usually cause shortness of breath or chest tightness, though he experiences wheezing infrequently. It tends to occur when he wakes up, sometimes when he goes to bed, or after eating. He has no issues with heartburn or reflux, although he is currently taking pantoprazole  with no benefit. Various treatments, including Tessalon  and over-the-counter allergy medications, provided temporary relief. He has not used inhalers recently, though he had them in the past for other issues.  He has undergone a chest X-ray and has not been hospitalized for breathing problems. No history of pneumonia and he does not smoke. He occasionally experiences post-nasal drainage and notes that his cough can worsen in the fall and spring. He has not had recent allergy testing related to his cough but recalls a severe allergic reaction to yellow jacket stings in his youth, for which he received allergy shots.  His current medications include cholesterol and blood pressure medications, famotidine , and pantoprazole . He previously  tried changing his blood pressure medication to see if it affected his cough, but noticed no difference and reverted to his original prescription. He denies any recent use of prednisone or steroids for his cough.  No fever, chills, frequent infections, hives, eczema, or rashes. He reports normal appetite and bowel movements.     Main triggers are unknown.  In the last month, frequency of symptoms: daily. No recent inhaler use.  Interference with physical activity: no. In the last 12 months, emergency room visits/urgent care visits/doctor office visits or hospitalizations due to respiratory issues: no. In the last 12 months, oral steroids courses: not recently. History of pneumonia: no.  Smoking exposure: no.  History of reflux: denies - currently taking pantoprazole  with no benefit.     Component     Latest Ref Rng 07/11/2023  WBC     4.0 - 10.5 K/uL 6.3   RBC     4.22 - 5.81 Mil/uL 4.57   Hemoglobin     13.0 - 17.0 g/dL 40.9   HCT     81.1 - 91.4 % 42.3   MCV     78.0 - 100.0 fl 92.7   MCHC     30.0 - 36.0 g/dL 78.2   RDW     95.6 - 21.3 % 13.2   Platelets     150.0 - 400.0 K/uL 212.0   Neutrophils     43.0 - 77.0 % 62.5   Lymphocytes     12.0 - 46.0 % 22.5   Monocytes Relative     3.0 - 12.0 % 9.9  Eosinophil     0.0 - 5.0 % 3.9   Basophil     0.0 - 3.0 % 1.2   NEUT#     1.4 - 7.7 K/uL 3.9   Lymphs Abs     0.7 - 4.0 K/uL 1.4   Monocyte #     0.1 - 1.0 K/uL 0.6   Eosinophils Absolute     0.0 - 0.7 K/uL 0.2   Basophils Absolute     0.0 - 0.1 K/uL 0.1     Component     Latest Ref Rng 07/11/2023  IgE (Immunoglobulin E), Serum     <OR=114 kU/L 566 (H)     07/11/2023 CXR: IMPRESSION: 1. No acute cardiopulmonary abnormality.  Assessment and Plan: Alfred Davis is a 68 y.o. male with: Chronic cough Not well controlled moderate persistent asthma Chronic cough likely multifactorial with spirometry suggestive of asthma. Environmental triggers possible given elevated  eos and IgE. 2025 CXR normal. Today's spirometry showed mixed obstructive and restrictive disease with 26% and 310cc improvement in FEV1 post bronchodilator treatment. Clinically feeling slightly improved.  Daily controller medication(s): start Symbicort 80mcg 2 puffs twice a day with spacer and rinse mouth afterwards. Spacer given and demonstrated proper use with inhaler. Patient understood technique and all questions/concerned were addressed.  May use albuterol rescue inhaler 2 puffs every 4 to 6 hours as needed for shortness of breath, chest tightness, coughing, and wheezing. May use albuterol rescue inhaler 2 puffs 5 to 15 minutes prior to strenuous physical activities. Monitor frequency of use - if you need to use it more than twice per week on a consistent basis let us  know.   Other allergic rhinitis Possible environmental allergies contributing to PND/coughing.  Return for allergy skin testing. Will make additional recommendations based on results. Use over the counter antihistamines such as Zyrtec (cetirizine), Claritin (loratadine), Allegra (fexofenadine), or Xyzal (levocetirizine) daily as needed. May take twice a day during allergy flares. May switch antihistamines every few months. Use azelastine nasal spray 1-2 sprays per nostril twice a day as needed for runny nose/drainage. Nasal saline spray (i.e., Simply Saline) or nasal saline lavage (i.e., NeilMed) is recommended as needed and prior to medicated nasal sprays.  Gastroesophageal reflux disease, unspecified whether esophagitis present Not sure if PPI helping. See handout for lifestyle and dietary modifications. Continue pantoprazole  40mg  once day - nothing to eat or drink for 20-30 minutes afterwards.   History of Hymenoptera allergy Reaction to yellow jacket and was on VIT in the past. No recent sting and no reactions. Monitor symptoms. Continue to avoid.  Return in about 4 weeks (around 09/12/2023).  Meds ordered this  encounter  Medications   budesonide-formoterol (SYMBICORT) 80-4.5 MCG/ACT inhaler    Sig: Inhale 2 puffs into the lungs in the morning and at bedtime. with spacer and rinse mouth afterwards.    Dispense:  1 each    Refill:  3   azelastine (ASTELIN) 0.1 % nasal spray    Sig: Place 1-2 sprays into both nostrils 2 (two) times daily as needed (nasal drainage). Use in each nostril as directed    Dispense:  30 mL    Refill:  5   albuterol (VENTOLIN HFA) 108 (90 Base) MCG/ACT inhaler    Sig: Inhale 2 puffs into the lungs every 4 (four) hours as needed for wheezing or shortness of breath (coughing fits).    Dispense:  18 g    Refill:  1   Lab Orders  No laboratory test(s) ordered  today    Other allergy screening: Rhino conjunctivitis: some PNDand cough. Worse in the fall and winter. No prior allergy testing.  Food allergy: no Medication allergy: no Hymenoptera allergy: yes Systemic reaction after a yellow jacket sting. Patient was on VIT for this.   Urticaria: no Eczema:no History of recurrent infections suggestive of immunodeficency: no  Diagnostics: Spirometry:  Tracings reviewed. His effort: It was hard to get consistent efforts and there is a question as to whether this reflects a maximal maneuver. FVC: 2.07L FEV1: 1.15L, 37% predicted FEV1/FVC ratio: 56% Interpretation: Spirometry consistent with mixed obstructive and restrictive disease with 26% and 310cc improvement in FEV1 post bronchodilator treatment. Clinically feeling slightly improved.   Please see scanned spirometry results for details.  Results discussed with patient/family.   Past Medical History: Patient Active Problem List   Diagnosis Date Noted   Obstructive sleep apnea syndrome 08/15/2023   Prediabetes 08/15/2023   Pure hypercholesterolemia 08/15/2023   Essential hypertension 08/15/2023   Allergic rhinitis 08/15/2023   Upper airway cough syndrome 07/11/2023   Past Medical History:  Diagnosis Date    Arthritis    Environmental and seasonal allergies    Hypertension    Left rotator cuff tear    Sleep apnea    uses CPAP nightly   Past Surgical History: Past Surgical History:  Procedure Laterality Date   SHOULDER ARTHROSCOPY Left 03/15/2020   Procedure: ARTHROSCOPY SHOULDER DEBRIDEMENT;  Surgeon: Sammye Cristal, MD;  Location: [facility];  Service: Orthopedics;  Laterality: Left;   SHOULDER ARTHROSCOPY WITH ROTATOR CUFF REPAIR Right 2018   TONSILLECTOMY     Medication List:  Current Outpatient Medications  Medication Sig Dispense Refill   albuterol (VENTOLIN HFA) 108 (90 Base) MCG/ACT inhaler Inhale 2 puffs into the lungs every 4 (four) hours as needed for wheezing or shortness of breath (coughing fits). 18 g 1   azelastine (ASTELIN) 0.1 % nasal spray Place 1-2 sprays into both nostrils 2 (two) times daily as needed (nasal drainage). Use in each nostril as directed 30 mL 5   benzonatate  (TESSALON ) 200 MG capsule Take 1 capsule (200 mg total) by mouth 3 (three) times daily as needed for cough. 30 capsule 1   budesonide-formoterol (SYMBICORT) 80-4.5 MCG/ACT inhaler Inhale 2 puffs into the lungs in the morning and at bedtime. with spacer and rinse mouth afterwards. 1 each 3   cetirizine (ZYRTEC) 10 MG tablet Take 10 mg by mouth daily.     famotidine  (PEPCID ) 20 MG tablet One after supper 30 tablet 11   pantoprazole  (PROTONIX ) 40 MG tablet Take 1 tablet (40 mg total) by mouth daily. Take 30-60 min before first meal of the day 30 tablet 2   rosuvastatin (CRESTOR) 5 MG tablet Take 5 mg by mouth daily.     valsartan-hydrochlorothiazide (DIOVAN-HCT) 320-12.5 MG tablet Take 1 tablet by mouth daily.     aspirin EC 81 MG tablet Take 81 mg by mouth daily. Swallow whole. (Patient not taking: Reported on 08/15/2023)     No current facility-administered medications for this visit.   Allergies: No Known Allergies Social History: Social History   Socioeconomic History   Marital  status: Married    Spouse name: Not on file   Number of children: Not on file   Years of education: Not on file   Highest education level: Not on file  Occupational History   Not on file  Tobacco Use   Smoking status: Never    Passive exposure: Never  Smokeless tobacco: Never  Substance and Sexual Activity   Alcohol use: Yes    Comment: social   Drug use: Never   Sexual activity: Not on file  Other Topics Concern   Not on file  Social History Narrative   Not on file   Social Drivers of Health   Financial Resource Strain: Not on file  Food Insecurity: Not on file  Transportation Needs: Not on file  Physical Activity: Not on file  Stress: Not on file  Social Connections: Not on file   Lives in a house. Smoking: denies Occupation: Scientist, research (physical sciences) HistorySurveyor, minerals in the house: no Engineer, civil (consulting) in the family room: yes Carpet in the bedroom: yes Heating: gas Cooling: central Pet: no  Family History: Family History  Problem Relation Age of Onset   Allergic rhinitis Mother    Review of Systems  Constitutional:  Negative for appetite change, chills, fever and unexpected weight change.  HENT:  Positive for postnasal drip. Negative for congestion and rhinorrhea.   Eyes:  Negative for itching.  Respiratory:  Positive for cough. Negative for chest tightness, shortness of breath and wheezing.   Cardiovascular:  Negative for chest pain.  Gastrointestinal:  Negative for abdominal pain.  Genitourinary:  Negative for difficulty urinating.  Skin:  Negative for rash.  Neurological:  Negative for headaches.    Objective: BP 124/78 (BP Location: Left Arm, Patient Position: Sitting, Cuff Size: Large)   Pulse 60   Resp 18   Ht 5' 8.5 (1.74 m)   Wt 256 lb 1.6 oz (116.2 kg)   SpO2 95%   BMI 38.37 kg/m  Body mass index is 38.37 kg/m. Physical Exam Vitals and nursing note reviewed.  Constitutional:      Appearance: Normal appearance. He is  well-developed.  HENT:     Head: Normocephalic and atraumatic.     Right Ear: Tympanic membrane and external ear normal.     Left Ear: Tympanic membrane and external ear normal.     Nose: Nose normal.     Mouth/Throat:     Mouth: Mucous membranes are moist.     Pharynx: Oropharynx is clear.   Eyes:     Conjunctiva/sclera: Conjunctivae normal.    Cardiovascular:     Rate and Rhythm: Normal rate and regular rhythm.     Heart sounds: Normal heart sounds. No murmur heard.    No friction rub. No gallop.  Pulmonary:     Effort: Pulmonary effort is normal.     Breath sounds: Normal breath sounds. No wheezing, rhonchi or rales.   Musculoskeletal:     Cervical back: Neck supple.   Skin:    General: Skin is warm.     Findings: No rash.   Neurological:     Mental Status: He is alert and oriented to person, place, and time.   Psychiatric:        Behavior: Behavior normal.    The plan was reviewed with the patient/family, and all questions/concerned were addressed.  It was my pleasure to see Alfred Davis today and participate in his care. Please feel free to contact me with any questions or concerns.  Sincerely,  Eudelia Hero, DO Allergy & Immunology  [facility]  [place] office: [phone] [facility] office: [phone]

## 2023-09-05 ENCOUNTER — Ambulatory Visit: Payer: Self-pay | Admitting: Allergy & Immunology

## 2023-09-18 NOTE — Progress Notes (Unsigned)
 Follow Up Note  RE: Alfred Davis MRN: [id number] DOB: [birth year] Date of Office Visit: 09/19/2023  Referring provider: Gib Charleston, MD Primary care provider: Gib Charleston, MD  Chief Complaint: No chief complaint on file.  History of Present Illness: I had the pleasure of seeing Alfred Davis for a follow up visit at the [facility] on 09/19/2023. He is a 68 y.o. male, who is being followed for asthma, cough, allergic rhinitis, GERD and history of hymenoptera allergy. His previous allergy office visit was on 08/15/2023 with Dr. Luke. Today is a regular follow up visit.  Discussed the use of AI scribe software for clinical note transcription with the patient, who gave verbal consent to proceed.  History of Present Illness            ***  Assessment and Plan: Alfred Davis is a 68 y.o. male with: Chronic cough Not well controlled moderate persistent asthma Chronic cough likely multifactorial with spirometry suggestive of asthma. Environmental triggers possible given elevated eos and IgE. 2025 CXR normal. Today's spirometry showed mixed obstructive and restrictive disease with 26% and 310cc improvement in FEV1 post bronchodilator treatment. Clinically feeling slightly improved.  Daily controller medication(s): start Symbicort  80mcg 2 puffs twice a day with spacer and rinse mouth afterwards. Spacer given and demonstrated proper use with inhaler. Patient understood technique and all questions/concerned were addressed.  May use albuterol  rescue inhaler 2 puffs every 4 to 6 hours as needed for shortness of breath, chest tightness, coughing, and wheezing. May use albuterol  rescue inhaler 2 puffs 5 to 15 minutes prior to strenuous physical activities. Monitor frequency of use - if you need to use it more than twice per week on a consistent basis let us  know.    Other allergic rhinitis Possible environmental allergies contributing to PND/coughing.  Return for allergy  skin testing. Will make additional recommendations based on results. Use over the counter antihistamines such as Zyrtec (cetirizine), Claritin (loratadine), Allegra (fexofenadine), or Xyzal (levocetirizine) daily as needed. May take twice a day during allergy flares. May switch antihistamines every few months. Use azelastine  nasal spray 1-2 sprays per nostril twice a day as needed for runny nose/drainage. Nasal saline spray (i.e., Simply Saline) or nasal saline lavage (i.e., NeilMed) is recommended as needed and prior to medicated nasal sprays.   Gastroesophageal reflux disease, unspecified whether esophagitis present Not sure if PPI helping. See handout for lifestyle and dietary modifications. Continue pantoprazole  40mg  once day - nothing to eat or drink for 20-30 minutes afterwards.    History of Hymenoptera allergy Reaction to yellow jacket and was on VIT in the past. No recent sting and no reactions. Monitor symptoms. Continue to avoid. Assessment and Plan              No follow-ups on file.  No orders of the defined types were placed in this encounter.  Lab Orders  No laboratory test(s) ordered today    Diagnostics: Spirometry:  Tracings reviewed. His effort: {Blank single:19197::Good reproducible efforts.,It was hard to get consistent efforts and there is a question as to whether this reflects a maximal maneuver.,Poor effort, data can not be interpreted.} FVC: ***L FEV1: ***L, ***% predicted FEV1/FVC ratio: ***% Interpretation: {Blank single:19197::Spirometry consistent with mild obstructive disease,Spirometry consistent with moderate obstructive disease,Spirometry consistent with severe obstructive disease,Spirometry consistent with possible restrictive disease,Spirometry consistent with mixed obstructive and restrictive disease,Spirometry uninterpretable due to technique,Spirometry consistent with normal pattern,No overt abnormalities noted given  today's efforts}.  Please see  scanned spirometry results for details.  Skin Testing: {Blank single:19197::Select foods,Environmental allergy panel,Environmental allergy panel and select foods,Food allergy panel,None,Deferred due to recent antihistamines use}. *** Results discussed with patient/family.   Medication List:  Current Outpatient Medications  Medication Sig Dispense Refill  . albuterol  (VENTOLIN  HFA) 108 (90 Base) MCG/ACT inhaler Inhale 2 puffs into the lungs every 4 (four) hours as needed for wheezing or shortness of breath (coughing fits). 18 g 1  . aspirin EC 81 MG tablet Take 81 mg by mouth daily. Swallow whole. (Patient not taking: Reported on 08/15/2023)    . azelastine  (ASTELIN ) 0.1 % nasal spray Place 1-2 sprays into both nostrils 2 (two) times daily as needed (nasal drainage). Use in each nostril as directed 30 mL 5  . benzonatate  (TESSALON ) 200 MG capsule Take 1 capsule (200 mg total) by mouth 3 (three) times daily as needed for cough. 30 capsule 1  . budesonide -formoterol  (SYMBICORT ) 80-4.5 MCG/ACT inhaler Inhale 2 puffs into the lungs in the morning and at bedtime. with spacer and rinse mouth afterwards. 1 each 3  . cetirizine (ZYRTEC) 10 MG tablet Take 10 mg by mouth daily.    . famotidine  (PEPCID ) 20 MG tablet One after supper 30 tablet 11  . pantoprazole  (PROTONIX ) 40 MG tablet Take 1 tablet (40 mg total) by mouth daily. Take 30-60 min before first meal of the day 30 tablet 2  . rosuvastatin (CRESTOR) 5 MG tablet Take 5 mg by mouth daily.    . valsartan-hydrochlorothiazide (DIOVAN-HCT) 320-12.5 MG tablet Take 1 tablet by mouth daily.     No current facility-administered medications for this visit.   Allergies: No Known Allergies I reviewed his past medical history, social history, family history, and environmental history and no significant changes have been reported from his previous visit.  Review of Systems  Constitutional:  Negative for appetite  change, chills, fever and unexpected weight change.  HENT:  Positive for postnasal drip. Negative for congestion and rhinorrhea.   Eyes:  Negative for itching.  Respiratory:  Positive for cough. Negative for chest tightness, shortness of breath and wheezing.   Cardiovascular:  Negative for chest pain.  Gastrointestinal:  Negative for abdominal pain.  Genitourinary:  Negative for difficulty urinating.  Skin:  Negative for rash.  Neurological:  Negative for headaches.    Objective: There were no vitals taken for this visit. There is no height or weight on file to calculate BMI. Physical Exam Vitals and nursing note reviewed.  Constitutional:      Appearance: Normal appearance. He is well-developed.  HENT:     Head: Normocephalic and atraumatic.     Right Ear: Tympanic membrane and external ear normal.     Left Ear: Tympanic membrane and external ear normal.     Nose: Nose normal.     Mouth/Throat:     Mouth: Mucous membranes are moist.     Pharynx: Oropharynx is clear.  Eyes:     Conjunctiva/sclera: Conjunctivae normal.  Cardiovascular:     Rate and Rhythm: Normal rate and regular rhythm.     Heart sounds: Normal heart sounds. No murmur heard.    No friction rub. No gallop.  Pulmonary:     Effort: Pulmonary effort is normal.     Breath sounds: Normal breath sounds. No wheezing, rhonchi or rales.  Musculoskeletal:     Cervical back: Neck supple.  Skin:    General: Skin is warm.     Findings: No rash.  Neurological:  Mental Status: He is alert and oriented to person, place, and time.  Psychiatric:        Behavior: Behavior normal.   Previous notes and tests were reviewed. The plan was reviewed with the patient/family, and all questions/concerned were addressed.  It was my pleasure to see Alfred Davis today and participate in his care. Please feel free to contact me with any questions or concerns.  Sincerely,  Orlan Cramp, DO Allergy & Immunology  [facility]  [facility] office: [phone] [facility] office: [phone]

## 2023-09-19 ENCOUNTER — Ambulatory Visit: Admitting: Allergy

## 2023-09-19 ENCOUNTER — Other Ambulatory Visit: Payer: Self-pay

## 2023-09-19 ENCOUNTER — Encounter: Payer: Self-pay | Admitting: Allergy

## 2023-09-19 VITALS — BP 124/74 | HR 56 | Temp 98.3°F | Resp 16 | Ht 67.91 in | Wt 255.1 lb

## 2023-09-19 DIAGNOSIS — Z91038 Other insect allergy status: Secondary | ICD-10-CM | POA: Diagnosis not present

## 2023-09-19 DIAGNOSIS — J3089 Other allergic rhinitis: Secondary | ICD-10-CM

## 2023-09-19 DIAGNOSIS — K219 Gastro-esophageal reflux disease without esophagitis: Secondary | ICD-10-CM | POA: Diagnosis not present

## 2023-09-19 DIAGNOSIS — R053 Chronic cough: Secondary | ICD-10-CM | POA: Diagnosis not present

## 2023-09-19 DIAGNOSIS — J4540 Moderate persistent asthma, uncomplicated: Secondary | ICD-10-CM

## 2023-09-19 MED ORDER — FLUTICASONE-SALMETEROL 250-50 MCG/ACT IN AEPB: 1.0000 | INHALATION_SPRAY | Freq: Two times a day (BID) | RESPIRATORY_TRACT | 3 refills | Status: DC

## 2023-09-19 NOTE — Patient Instructions (Addendum)
 Coughing/asthma Coughing most likely multifactorial but you do seem to have a component of asthma.  Daily controller medication(s): start generic Advair Diskus 250mcg 1 puff twice a day and rinse mouth after each use. Let me know if too expensive.  Check the pricing for the following inhalers: Dulera Advair HFA Wixela Breo Breyna   May use albuterol  rescue inhaler 2 puffs every 4 to 6 hours as needed for shortness of breath, chest tightness, coughing, and wheezing. May use albuterol  rescue inhaler 2 puffs 5 to 15 minutes prior to strenuous physical activities. Monitor frequency of use - if you need to use it more than twice per week on a consistent basis let us  know.  Breathing control goals:  Full participation in all desired activities (may need albuterol  before activity) Albuterol  use two times or less a week on average (not counting use with activity) Cough interfering with sleep two times or less a month Oral steroids no more than once a year No hospitalizations   Reflux Continue lifestyle and dietary modifications. Continue pantoprazole  40mg  once day - nothing to eat or drink for 20-30 minutes afterwards.   Rhinitis  Get bloodwork We are ordering labs, so please allow 1-2 weeks for the results to come back. With the newly implemented Cures Act, the labs might be visible to you at the same time that they become visible to me. However, I will not address the results until all of the results are back, so please be patient.  In the meantime, continue recommendations in your patient instructions, including avoidance measures (if applicable), until you hear from me.   Use over the counter antihistamines such as Zyrtec (cetirizine), Claritin (loratadine), Allegra (fexofenadine), or Xyzal (levocetirizine) daily as needed. May take twice a day during allergy flares. May switch antihistamines every few months. Use azelastine  nasal spray 1-2 sprays per nostril twice a day as needed for  runny nose/drainage. Nasal saline spray (i.e., Simply Saline) or nasal saline lavage (i.e., NeilMed) is recommended as needed and prior to medicated nasal sprays.  Follow up in 2 months or sooner if needed.

## 2023-09-21 ENCOUNTER — Ambulatory Visit: Payer: Self-pay | Admitting: Allergy

## 2023-09-21 LAB — ALLERGENS W/TOTAL IGE AREA 2
Alternaria Alternata IgE: <0.1 kU/L
Aspergillus Fumigatus IgE: <0.1 kU/L
Bermuda Grass IgE: <0.1 kU/L
Cat Dander IgE: <0.1 kU/L
Cedar, Mountain IgE: <0.1 kU/L
Cladosporium Herbarum IgE: <0.1 kU/L
Cockroach, German IgE: 2.95 kU/L — AB
Common Silver Birch IgE: <0.1 kU/L
Cottonwood IgE: <0.1 kU/L
D Farinae IgE: 0.69 kU/L — AB
D Pteronyssinus IgE: 0.59 kU/L — AB
Dog Dander IgE: <0.1 kU/L
Elm, American IgE: <0.1 kU/L
IgE (Immunoglobulin E), Serum: 477 [IU]/mL (ref 6–495)
Johnson Grass IgE: <0.1 kU/L
Maple/Box Elder IgE: <0.1 kU/L
Mouse Urine IgE: <0.1 kU/L
Oak, White IgE: <0.1 kU/L
Pecan, Hickory IgE: <0.1 kU/L
Penicillium Chrysogen IgE: <0.1 kU/L
Pigweed, Rough IgE: <0.1 kU/L
Ragweed, Short IgE: <0.1 kU/L
Sheep Sorrel IgE Qn: <0.1 kU/L
Timothy Grass IgE: <0.1 kU/L
White Mulberry IgE: <0.1 kU/L

## 2023-11-24 ENCOUNTER — Other Ambulatory Visit: Payer: Self-pay | Admitting: Internal Medicine

## 2023-11-24 DIAGNOSIS — R058 Other specified cough: Secondary | ICD-10-CM

## 2023-11-29 DIAGNOSIS — Z1331 Encounter for screening for depression: Secondary | ICD-10-CM | POA: Diagnosis not present

## 2023-11-29 DIAGNOSIS — Z125 Encounter for screening for malignant neoplasm of prostate: Secondary | ICD-10-CM | POA: Diagnosis not present

## 2023-11-29 DIAGNOSIS — K219 Gastro-esophageal reflux disease without esophagitis: Secondary | ICD-10-CM | POA: Diagnosis not present

## 2023-11-29 DIAGNOSIS — E7800 Pure hypercholesterolemia, unspecified: Secondary | ICD-10-CM | POA: Diagnosis not present

## 2023-11-29 DIAGNOSIS — Z23 Encounter for immunization: Secondary | ICD-10-CM | POA: Diagnosis not present

## 2023-11-29 DIAGNOSIS — I10 Essential (primary) hypertension: Secondary | ICD-10-CM | POA: Diagnosis not present

## 2023-11-29 DIAGNOSIS — R7303 Prediabetes: Secondary | ICD-10-CM | POA: Diagnosis not present

## 2023-11-29 DIAGNOSIS — J4540 Moderate persistent asthma, uncomplicated: Secondary | ICD-10-CM | POA: Diagnosis not present

## 2023-11-29 DIAGNOSIS — Z0000 Encounter for general adult medical examination without abnormal findings: Secondary | ICD-10-CM | POA: Diagnosis not present

## 2023-12-23 NOTE — Progress Notes (Unsigned)
 Follow Up Note  RE: Alfred Davis MRN: [id number] DOB: 1955/07/19 Date of Office Visit: 12/24/2023  Referring provider: Gib Charleston, MD Primary care provider: Gib Charleston, MD  Chief Complaint: No chief complaint on file.  History of Present Illness: I had the pleasure of seeing Alfred Davis for a follow up visit at the [facility] on 12/24/2023. He is a 68 y.o. male, who is being followed for asthma, allergic rhinitis, GERD, history of hymenoptera allergy. His previous allergy office visit was on 09/19/2023 with Dr. Luke. Today is a regular follow up visit.  Discussed the use of AI scribe software for clinical note transcription with the patient, who gave verbal consent to proceed.  History of Present Illness            2025 labs: Bloodwork was positive to dust mites and cockroaches. Please see below for environmental control measures.  Assessment and Plan: Alfred Davis is a 68 y.o. male with: Chronic cough Not well controlled moderate persistent asthma Past history - Chronic cough likely multifactorial with spirometry suggestive of asthma. Environmental triggers possible given elevated eos and IgE. 2025 CXR normal. 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 Clinically feeling slightly improved.  Interim history - unchanged as didn't pick up Symbicort  due to cost.  Today's spirometry consistent with mixed obstructive and restrictive disease. Daily controller medication(s): start generic Advair Diskus 250mcg 1 puff twice a day and rinse mouth after each use. Let me know if too expensive.  May use albuterol  rescue inhaler 2 puffs every 4 to 6 hours as needed for shortness of breath, chest tightness, coughing, and wheezing. May use albuterol  rescue inhaler 2 puffs 5 to 15 minutes prior to strenuous physical activities. Monitor frequency of use - if you need to use it  more than twice per week on a consistent basis let us  know.    Other allergic rhinitis Past history - possible environmental allergies contributing to PND/coughing.  Interim history - worsening symptoms off antihistamines. No skin testing today due to poor respiratory status.  Get bloodwork and will make additional recommendations based on results.  Use over the counter antihistamines such as Zyrtec (cetirizine), Claritin (loratadine), Allegra (fexofenadine), or Xyzal (levocetirizine) daily as needed. May take twice a day during allergy flares. May switch antihistamines every few months. Use azelastine  nasal spray 1-2 sprays per nostril twice a day as needed for runny nose/drainage. Nasal saline spray (i.e., Simply Saline) or nasal saline lavage (i.e., NeilMed) is recommended as needed and prior to medicated nasal sprays.   Gastroesophageal reflux disease, unspecified whether esophagitis present Past history - Not sure if PPI helping. Continue lifestyle and dietary modifications. Continue pantoprazole  40mg  once day - nothing to eat or drink for 20-30 minutes afterwards.    History of Hymenoptera allergy Past history - Reaction to yellow jacket and was on VIT in the past. No recent sting and no reactions. Monitor symptoms. Continue to avoid. Assessment and Plan              No follow-ups on file.  No orders of the defined types were placed in this encounter.  Lab Orders  No laboratory test(s) ordered today    Diagnostics: Spirometry:  Tracings reviewed. His effort: {Blank single:19197::Good reproducible efforts.,It was hard to get consistent efforts and there is a question as to whether this reflects a maximal maneuver.,Poor effort, data can not be interpreted.} FVC: ***L FEV1: ***  L, ***% predicted FEV1/FVC ratio: ***% Interpretation: {Blank single:19197::Spirometry consistent with mild obstructive disease,Spirometry consistent with moderate obstructive  disease,Spirometry consistent with severe obstructive disease,Spirometry consistent with possible restrictive disease,Spirometry consistent with mixed obstructive and restrictive disease,Spirometry uninterpretable due to technique,Spirometry consistent with normal pattern,No overt abnormalities noted given today's efforts}.  Please see scanned spirometry results for details.  Skin Testing: {Blank single:19197::Select foods,Environmental allergy panel,Environmental allergy panel and select foods,Food allergy panel,None,Deferred due to recent antihistamines use}. *** Results discussed with patient/family.   Medication List:  Current Outpatient Medications  Medication Sig Dispense Refill  . albuterol  (VENTOLIN  HFA) 108 (90 Base) MCG/ACT inhaler Inhale 2 puffs into the lungs every 4 (four) hours as needed for wheezing or shortness of breath (coughing fits). 18 g 1  . aspirin EC 81 MG tablet Take 81 mg by mouth daily. Swallow whole. (Patient not taking: Reported on 09/19/2023)    . azelastine  (ASTELIN ) 0.1 % nasal spray Place 1-2 sprays into both nostrils 2 (two) times daily as needed (nasal drainage). Use in each nostril as directed 30 mL 5  . benzonatate  (TESSALON ) 200 MG capsule Take 1 capsule (200 mg total) by mouth 3 (three) times daily as needed for cough. 30 capsule 1  . cetirizine (ZYRTEC) 10 MG tablet Take 10 mg by mouth daily.    . famotidine  (PEPCID ) 20 MG tablet One after supper 30 tablet 11  . fluticasone -salmeterol (ADVAIR DISKUS) 250-50 MCG/ACT AEPB Inhale 1 puff into the lungs in the morning and at bedtime. Rinse mouth after each use. 60 each 3  . pantoprazole  (PROTONIX ) 40 MG tablet TAKE 1 TABLET BY MOUTH DAILY *TAKE 30-60 MINUTES BEFORE FIRST MEAL OF THE DAY 30 tablet 2  . rosuvastatin (CRESTOR) 5 MG tablet Take 5 mg by mouth daily.    . valsartan-hydrochlorothiazide (DIOVAN-HCT) 320-12.5 MG tablet Take 1 tablet by mouth daily.     No current  facility-administered medications for this visit.   Allergies: No Known Allergies I reviewed his past medical history, social history, family history, and environmental history and no significant changes have been reported from his previous visit.  Review of Systems  Constitutional:  Negative for appetite change, chills, fever and unexpected weight change.  HENT:  Positive for postnasal drip. Negative for congestion and rhinorrhea.   Eyes:  Negative for itching.  Respiratory:  Positive for cough. Negative for chest tightness, shortness of breath and wheezing.   Cardiovascular:  Negative for chest pain.  Gastrointestinal:  Negative for abdominal pain.  Genitourinary:  Negative for difficulty urinating.  Skin:  Negative for rash.  Allergic/Immunologic: Positive for environmental allergies.  Neurological:  Negative for headaches.    Objective: There were no vitals taken for this visit. There is no height or weight on file to calculate BMI. Physical Exam Vitals and nursing note reviewed.  Constitutional:      Appearance: Normal appearance. He is well-developed.  HENT:     Head: Normocephalic and atraumatic.     Right Ear: Tympanic membrane and external ear normal.     Left Ear: Tympanic membrane and external ear normal.     Nose: Nose normal.     Mouth/Throat:     Mouth: Mucous membranes are moist.     Pharynx: Oropharynx is clear.  Eyes:     Conjunctiva/sclera: Conjunctivae normal.  Cardiovascular:     Rate and Rhythm: Normal rate and regular rhythm.     Heart sounds: Normal heart sounds. No murmur heard.    No friction rub. No  gallop.  Pulmonary:     Effort: Pulmonary effort is normal.     Breath sounds: Normal breath sounds. No wheezing, rhonchi or rales.  Musculoskeletal:     Cervical back: Neck supple.  Skin:    General: Skin is warm.     Findings: No rash.  Neurological:     Mental Status: He is alert and oriented to person, place, and time.  Psychiatric:         Behavior: Behavior normal.   Previous notes and tests were reviewed. The plan was reviewed with the patient/family, and all questions/concerned were addressed.  It was my pleasure to see Alfred Davis today and participate in his care. Please feel free to contact me with any questions or concerns.  Sincerely,  Alfred Cramp, DO Allergy & Immunology  [facility]  [place] office: [phone] [facility] office: [phone]

## 2023-12-24 ENCOUNTER — Telehealth: Payer: Self-pay | Admitting: Allergy

## 2023-12-24 ENCOUNTER — Ambulatory Visit: Admitting: Allergy

## 2023-12-24 ENCOUNTER — Encounter: Payer: Self-pay | Admitting: Allergy

## 2023-12-24 ENCOUNTER — Other Ambulatory Visit: Payer: Self-pay

## 2023-12-24 VITALS — BP 142/98 | HR 48 | Temp 97.9°F | Resp 14 | Ht 68.0 in | Wt 263.8 lb

## 2023-12-24 DIAGNOSIS — J3089 Other allergic rhinitis: Secondary | ICD-10-CM | POA: Diagnosis not present

## 2023-12-24 DIAGNOSIS — J4540 Moderate persistent asthma, uncomplicated: Secondary | ICD-10-CM

## 2023-12-24 DIAGNOSIS — R053 Chronic cough: Secondary | ICD-10-CM

## 2023-12-24 DIAGNOSIS — Z91038 Other insect allergy status: Secondary | ICD-10-CM

## 2023-12-24 NOTE — Telephone Encounter (Signed)
 Refer to ENT for chronic coughing.  TY.

## 2023-12-24 NOTE — Patient Instructions (Addendum)
 Coughing/asthma Coughing most likely multifactorial but you do seem to have a component of asthma. Refer to ENT.   Daily controller medication(s): Advair Diskus 250mcg 1 puff twice a day and rinse mouth after each use. May use albuterol  rescue inhaler 2 puffs every 4 to 6 hours as needed for shortness of breath, chest tightness, coughing, and wheezing. May use albuterol  rescue inhaler 2 puffs 5 to 15 minutes prior to strenuous physical activities. Monitor frequency of use - if you need to use it more than twice per week on a consistent basis let us  know.  Breathing control goals:  Full participation in all desired activities (may need albuterol  before activity) Albuterol  use two times or less a week on average (not counting use with activity) Cough interfering with sleep two times or less a month Oral steroids no more than once a year No hospitalizations   Reflux Continue lifestyle and dietary modifications. Continue pantoprazole  40mg  once day - nothing to eat or drink for 20-30 minutes afterwards.   Rhinitis  2025 labs positive to dust mites and cockroaches. See below for environmental control measures.  Use over the counter antihistamines such as Zyrtec (cetirizine), Claritin (loratadine), Allegra (fexofenadine), or Xyzal (levocetirizine) daily as needed. May take twice a day during allergy flares. May switch antihistamines every few months. Use azelastine  nasal spray 1-2 sprays per nostril twice a day as needed for runny nose/drainage. Nasal saline spray (i.e., Simply Saline) or nasal saline lavage (i.e., NeilMed) is recommended as needed and prior to medicated nasal sprays.  Follow up in 2 months or sooner if needed.   Control of House Dust Mite Allergen Dust mite allergens are a common trigger of allergy and asthma symptoms. While they can be found throughout the house, these microscopic creatures thrive in warm, humid environments such as bedding, upholstered furniture and  carpeting. Because so much time is spent in the bedroom, it is essential to reduce mite levels there.  Encase pillows, mattresses, and box springs in special allergen-proof fabric covers or airtight, zippered plastic covers.  Bedding should be washed weekly in hot water (130 F) and dried in a hot dryer. Allergen-proof covers are available for comforters and pillows that can't be regularly washed.  Wash the allergy-proof covers every few months. Minimize clutter in the bedroom. Keep pets out of the bedroom.  Keep humidity less than 50% by using a dehumidifier or air conditioning. You can buy a humidity measuring device called a hygrometer to monitor this.  If possible, replace carpets with hardwood, linoleum, or washable area rugs. If that's not possible, vacuum frequently with a vacuum that has a HEPA filter. Remove all upholstered furniture and non-washable window drapes from the bedroom. Remove all non-washable stuffed toys from the bedroom.  Wash stuffed toys weekly.  Cockroach Allergen Avoidance Cockroaches are often found in the homes of densely populated urban areas, schools or commercial buildings, but these creatures can lurk almost anywhere. This does not mean that you have a dirty house or living area. Block all areas where roaches can enter the home. This includes crevices, wall cracks and windows.  Cockroaches need water to survive, so fix and seal all leaky faucets and pipes. Have an exterminator go through the house when your family and pets are gone to eliminate any remaining roaches. Keep food in lidded containers and put pet food dishes away after your pets are done eating. Vacuum and sweep the floor after meals, and take out garbage and recyclables. Use lidded garbage containers  in the kitchen. Wash dishes immediately after use and clean under stoves, refrigerators or toasters where crumbs can accumulate. Wipe off the stove and other kitchen surfaces and cupboards regularly.

## 2024-01-01 ENCOUNTER — Encounter (INDEPENDENT_AMBULATORY_CARE_PROVIDER_SITE_OTHER): Payer: Self-pay

## 2024-01-27 ENCOUNTER — Other Ambulatory Visit: Payer: Self-pay | Admitting: Allergy

## 2024-02-04 ENCOUNTER — Ambulatory Visit (INDEPENDENT_AMBULATORY_CARE_PROVIDER_SITE_OTHER): Admitting: Physician Assistant

## 2024-02-04 ENCOUNTER — Encounter (INDEPENDENT_AMBULATORY_CARE_PROVIDER_SITE_OTHER): Payer: Self-pay | Admitting: Physician Assistant

## 2024-02-04 VITALS — BP 145/75 | HR 53 | Temp 97.7°F | Ht 69.0 in

## 2024-02-04 DIAGNOSIS — R053 Chronic cough: Secondary | ICD-10-CM | POA: Diagnosis not present

## 2024-02-04 DIAGNOSIS — K1379 Other lesions of oral mucosa: Secondary | ICD-10-CM | POA: Diagnosis not present

## 2024-02-04 NOTE — Progress Notes (Unsigned)
 Dear Dr. Luke, Here is my assessment for our mutual patient, Glade Strausser. Thank you for allowing me the opportunity to care for your patient. Please do not hesitate to contact me should you have any other questions. Sincerely, Chyrl Cohen PA-C  Otolaryngology Clinic Note Referring provider: Dr. Luke HPI:  Alfred Davis is a 68 y.o. male kindly referred by Dr. Luke   Discussed the use of AI scribe software for clinical note transcription with the patient, who gave verbal consent to proceed.  History of Present Illness    Alfred Davis is a 68 year old male who presents with a chronic cough. He was referred by an allergist for further evaluation of his chronic cough.  He has experienced a chronic cough for several years, which is worse in the morning, after eating, and when going to bed at night. The cough occasionally produces clear, liquid sputum that is not thick or mucousy. He has undergone a chest x-ray, possibly in May, as part of the workup by a pulmonologist, who then referred him to an allergist.  He has been prescribed azelastine  nasal spray to use when congested and an inhaler for emergency use during severe coughing spells. Another inhaler was also prescribed, but he has not noticed significant improvement with these treatments. He has a history of year-round allergies, with seasonal exacerbations. No smoking history, fevers, weight loss, night sweats, chest pain, or throat pain. He denies experiencing acid reflux, despite having been prescribed Prilosec in the past without noticeable benefit.  He reports a family history of similar symptoms in his father.        Independent Review of Additional Tests or Records:  none   PMH/Meds/All/SocHx/FamHx/ROS:   Past Medical History:  Diagnosis Date   Arthritis    Environmental and seasonal allergies    Hypertension    Left rotator cuff tear    Sleep apnea    uses CPAP nightly     Past Surgical History:  Procedure  Laterality Date   SHOULDER ARTHROSCOPY Left 03/15/2020   Procedure: ARTHROSCOPY SHOULDER DEBRIDEMENT;  Surgeon: Dozier Soulier, MD;  Location: [facility];  Service: Orthopedics;  Laterality: Left;   SHOULDER ARTHROSCOPY WITH ROTATOR CUFF REPAIR Right 2018   TONSILLECTOMY      Family History  Problem Relation Age of Onset   Allergic rhinitis Mother      Social Connections: Not on file      Current Outpatient Medications:    albuterol  (VENTOLIN  HFA) 108 (90 Base) MCG/ACT inhaler, Inhale 2 puffs into the lungs every 4 (four) hours as needed for wheezing or shortness of breath (coughing fits)., Disp: 18 g, Rfl: 1   azelastine  (ASTELIN ) 0.1 % nasal spray, Place 1-2 sprays into both nostrils 2 (two) times daily as needed (nasal drainage). Use in each nostril as directed, Disp: 30 mL, Rfl: 5   cetirizine (ZYRTEC) 10 MG tablet, Take 10 mg by mouth daily., Disp: , Rfl:    famotidine  (PEPCID ) 40 MG tablet, Take 40 mg by mouth once as needed for heartburn or indigestion., Disp: , Rfl:    rosuvastatin (CRESTOR) 5 MG tablet, Take 5 mg by mouth daily., Disp: , Rfl:    valsartan-hydrochlorothiazide (DIOVAN-HCT) 320-12.5 MG tablet, Take 1 tablet by mouth daily., Disp: , Rfl:    WIXELA INHUB 250-50 MCG/ACT AEPB, INHALE 1 PUFF INTO THE LUNGS IN THE MORNING AND AT BEDTIME. RINSE MOUTH AFTER EACH USE., Disp: 60 each, Rfl: 3   aspirin EC 81 MG tablet,  Take 81 mg by mouth daily. Swallow whole. (Patient not taking: Reported on 09/19/2023), Disp: , Rfl:    pantoprazole  (PROTONIX ) 40 MG tablet, TAKE 1 TABLET BY MOUTH DAILY *TAKE 30-60 MINUTES BEFORE FIRST MEAL OF THE DAY (Patient not taking: Reported on 02/04/2024), Disp: 30 tablet, Rfl: 2   Physical Exam:   BP (!) 145/75   Pulse (!) 53   Temp 97.7 F (36.5 C)   Ht 5' 9 (1.753 m)   SpO2 97%   BMI 38.96 kg/m   Pertinent Findings  CN II-XII grossly intact Bilateral EAC clear and TM intact with well pneumatized , narow EAC middle ear  spaces Weber 512: equal Rinne 512: AC > BC b/l  Anterior rhinoscopy: Septum midline; bilateral inferior turbinates with moderate nasal hypertrophy No lesions of oral cavity/oropharynx; dentition WNL No obviously palpable neck masses/lymphadenopathy/thyromegaly No respiratory distress or stridor       Seprately Identifiable Procedures:  Procedure Note Pre-procedure diagnosis:  *** Post-procedure diagnosis: Same Procedure: Transnasal Fiberoptic Laryngoscopy, CPT 68424 - Mod 25 Indication: see above Complications: None apparent EBL: 0 mL   The procedure was undertaken to further evaluate the patient's complaint of ***, with mirror exam inadequate for appropriate examination due to gag reflex and poor patient tolerance   Procedure:  Patient was identified as correct patient. Verbal consent was obtained. The nose was sprayed with oxymetazoline and 4% lidocaine . The The flexible laryngoscope was passed through the nose to view the nasal cavity, pharynx (oropharynx, hypopharynx) and larynx.  The larynx was examined at rest and during multiple phonatory tasks. Documentation was obtained and reviewed with patient. The scope was removed. The patient tolerated the procedure well.   Findings: The nasal cavity and nasopharynx did not reveal any masses or lesions, mucosa appeared to be without obvious lesions. The tongue base, pharyngeal walls, piriform sinuses, vallecula, epiglottis and postcricoid region are normal in appearance. The visualized portion of the subglottis and proximal trachea is widely patent. The vocal folds are mobile bilaterally.. There are no lesions on the free edge of the vocal folds nor elsewhere in the larynx worrisome for malignancy.   Impression & Plans:  Alfred Davis is a 68 y.o. male with the following   Assessment and Plan    Chronic cough evaluation Chronic cough with no definitive cause identified. Differential includes postnasal drainage, allergies, and acid  reflux. Nasal endoscopy showed normal vocal cords but long uvula, which may contribute to symptoms. - Consulted with throat physician regarding long uvula. - Discussed treatment options including speech therapy and nerve injections if indicated.  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 Further evaluation needed to determine if uvula length is causing symptoms. - Consulted with throat physician to evaluate long uvula. - Will discuss potential interventions if uvula is determined to be a contributing factor.           - f/u ***   Thank you for allowing me the opportunity to care for your patient. Please do not hesitate to contact me should you have any other questions.  Sincerely, Chyrl Cohen PA-C [facility] Phone: [phone] Fax: [phone]  02/04/2024, 11:38 AM

## 2024-02-04 NOTE — Progress Notes (Unsigned)
 Patient is having BP managed. Patient explains that he has been stressed lately.

## 2024-02-07 ENCOUNTER — Telehealth (INDEPENDENT_AMBULATORY_CARE_PROVIDER_SITE_OTHER): Payer: Self-pay | Admitting: Physician Assistant

## 2024-02-07 NOTE — Telephone Encounter (Signed)
 Called to discuss treatment plan, LVM.

## 2024-02-08 NOTE — Telephone Encounter (Signed)
 Alfred Davis was seen on 12/85 at 11:00 am with Dr. Palmer

## 2024-02-13 ENCOUNTER — Encounter: Payer: Self-pay | Admitting: Allergy

## 2024-02-13 ENCOUNTER — Other Ambulatory Visit: Payer: Self-pay

## 2024-02-13 ENCOUNTER — Ambulatory Visit: Admitting: Allergy

## 2024-02-13 VITALS — BP 136/90 | HR 52 | Temp 98.4°F | Resp 14 | Ht 69.0 in | Wt 262.0 lb

## 2024-02-13 DIAGNOSIS — R053 Chronic cough: Secondary | ICD-10-CM | POA: Diagnosis not present

## 2024-02-13 DIAGNOSIS — Z91038 Other insect allergy status: Secondary | ICD-10-CM

## 2024-02-13 DIAGNOSIS — J4540 Moderate persistent asthma, uncomplicated: Secondary | ICD-10-CM

## 2024-02-13 DIAGNOSIS — J3089 Other allergic rhinitis: Secondary | ICD-10-CM | POA: Diagnosis not present

## 2024-02-13 DIAGNOSIS — K219 Gastro-esophageal reflux disease without esophagitis: Secondary | ICD-10-CM | POA: Diagnosis not present

## 2024-02-13 MED ORDER — PANTOPRAZOLE SODIUM 40 MG PO TBEC: 40.0000 mg | DELAYED_RELEASE_TABLET | Freq: Two times a day (BID) | ORAL | 2 refills | Status: AC

## 2024-02-13 NOTE — Patient Instructions (Addendum)
 Coughing/asthma Coughing most likely multifactorial but you do seem to have a component of asthma. Follow up with ENT regarding the uvula.  Daily controller medication(s): Advair Diskus 250mcg 1 puff twice a day and rinse mouth after each use. May use albuterol  rescue inhaler 2 puffs every 4 to 6 hours as needed for shortness of breath, chest tightness, coughing, and wheezing. May use albuterol  rescue inhaler 2 puffs 5 to 15 minutes prior to strenuous physical activities. Monitor frequency of use - if you need to use it more than twice per week on a consistent basis let us  know.  Breathing control goals:  Full participation in all desired activities (may need albuterol  before activity) Albuterol  use two times or less a week on average (not counting use with activity) Cough interfering with sleep two times or less a month Oral steroids no more than once a year No hospitalizations   Reflux See handout for lifestyle and dietary modifications. Pantoprazole  40mg  twice day - nothing to eat or drink for 20-30 minutes afterwards.   Rhinitis  2025 labs positive to dust mites and cockroaches. Continue environmental control measures.  Use over the counter antihistamines such as Zyrtec (cetirizine), Claritin (loratadine), Allegra (fexofenadine), or Xyzal (levocetirizine) daily as needed. May take twice a day during allergy flares. May switch antihistamines every few months. Use azelastine  nasal spray 1-2 sprays per nostril twice a day as needed for runny nose/drainage. Nasal saline spray (i.e., Simply Saline) or nasal saline lavage (i.e., NeilMed) is recommended as needed and prior to medicated nasal sprays.  Follow up in 2 months or sooner if needed.   Control of House Dust Mite Allergen Dust mite allergens are a common trigger of allergy and asthma symptoms. While they can be found throughout the house, these microscopic creatures thrive in warm, humid environments such as bedding, upholstered  furniture and carpeting. Because so much time is spent in the bedroom, it is essential to reduce mite levels there.  Encase pillows, mattresses, and box springs in special allergen-proof fabric covers or airtight, zippered plastic covers.  Bedding should be washed weekly in hot water (130 F) and dried in a hot dryer. Allergen-proof covers are available for comforters and pillows that cant be regularly washed.  Wash the allergy-proof covers every few months. Minimize clutter in the bedroom. Keep pets out of the bedroom.  Keep humidity less than 50% by using a dehumidifier or air conditioning. You can buy a humidity measuring device called a hygrometer to monitor this.  If possible, replace carpets with hardwood, linoleum, or washable area rugs. If that's not possible, vacuum frequently with a vacuum that has a HEPA filter. Remove all upholstered furniture and non-washable window drapes from the bedroom. Remove all non-washable stuffed toys from the bedroom.  Wash stuffed toys weekly.  Cockroach Allergen Avoidance Cockroaches are often found in the homes of densely populated urban areas, schools or commercial buildings, but these creatures can lurk almost anywhere. This does not mean that you have a dirty house or living area. Block all areas where roaches can enter the home. This includes crevices, wall cracks and windows.  Cockroaches need water to survive, so fix and seal all leaky faucets and pipes. Have an exterminator go through the house when your family and pets are gone to eliminate any remaining roaches. Keep food in lidded containers and put pet food dishes away after your pets are done eating. Vacuum and sweep the floor after meals, and take out garbage and recyclables. Use lidded  garbage containers in the kitchen. Wash dishes immediately after use and clean under stoves, refrigerators or toasters where crumbs can accumulate. Wipe off the stove and other kitchen surfaces and cupboards  regularly.

## 2024-02-13 NOTE — Progress Notes (Signed)
 Follow Up Note  RE: Alfred Davis MRN: [id number] DOB: [birth year] Date of Office Visit: 02/13/2024  Referring provider: Gib Charleston, MD Primary care provider: Seabron Lenis, MD  Chief Complaint: Follow-up (He is still coughing )  History of Present Illness: I had the pleasure of seeing Alfred Davis for a follow up visit at the [facility] on 02/13/2024. He is a 68 y.o. male, who is being followed for asthma, allergic rhinitis, GERD, history of hymenoptera allergy. His previous allergy office visit was on 12/24/2023 with Dr. Luke. Today is a regular follow up visit.  Discussed the use of AI scribe software for clinical note transcription with the patient, who gave verbal consent to proceed.    His coughing has remained about the same since his last visit approximately one and a half to two months ago. He uses his Advair inhaler, 250 mcg, one puff twice a day. He has used his rescue inhaler once or twice, which provided relief, but he does not recall the specific circumstances that necessitated its use.  He recently visited an ear, nose, and throat (ENT) doctor who suggested that acid reflux might be contributing to his symptoms. He has started taking pantoprazole  more regularly, aiming to increase to twice daily. He acknowledges that he was not consistently taking it even once a day previously. 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   He experiences occasional symptoms after eating, which he attributes to acid reflux. His cough can be triggered by environmental changes, such as moving from cold to warm environments, and occasionally upon waking.  He is currently taking Zyrtec for allergies and has used nasal sprays occasionally. He has allergies to dust mites and cockroaches. No fevers or chills.      02/04/2024 ENT visit: Chronic cough evaluation Chronic cough with no definitive cause identified. Differential includes postnasal drainage, allergies, and  acid reflux. Nasal endoscopy showed normal vocal cords but long uvula, which may contribute to symptoms.  The patient has had symptoms for several years, he has seen pulmonology, he has tried medications for reflux, numerous medications for postnasal drainage with no significant improvement in his symptoms. - Consulted with throat physician regarding long uvula. - Discussed treatment options including speech therapy and nerve injections if indicated.   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 Further evaluation needed to determine if uvula length is causing symptoms. - Consulted with  physician to evaluate long uvula. - Will discuss potential interventions if uvula is determined to be a contributing factor.   Assessment and Plan: Alfred Davis is a 68 y.o. male with: Chronic cough Asthma Past history - Chronic cough likely multifactorial with spirometry suggestive of asthma. Environmental triggers possible given elevated eos and IgE. 2025 CXR normal. 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 Clinically feeling slightly improved.  Interim history - not sure if Advair is helping but had to use albuterol  on less occasions during coughing fits with good benefit. Saw ENT (long uvula and gerd component?) Today's spirometry showed some restriction.  Coughing most likely multifactorial but you do seem to have a component of asthma. Follow up with ENT regarding the uvula. Daily controller medication(s): Advair Diskus 250mcg 1 puff twice a day and rinse mouth after each use. May use albuterol  rescue inhaler 2 puffs every 4 to 6 hours as  needed for shortness of breath, chest tightness, coughing, and wheezing. May use albuterol  rescue inhaler 2 puffs 5 to 15 minutes prior to strenuous physical  activities. Monitor frequency of use - if you need to use it more than twice per week on a consistent basis let us  know.    Allergic rhinitis due to dust mite Allergy to cockroaches Past history - possible environmental allergies contributing to PND/coughing. 2025 labs positive to dust mites and cockroaches. Continue environmental control measures.  Use over the counter antihistamines such as Zyrtec (cetirizine), Claritin (loratadine), Allegra (fexofenadine), or Xyzal (levocetirizine) daily as needed. May take twice a day during allergy flares. May switch antihistamines every few months. Use azelastine  nasal spray 1-2 sprays per nostril twice a day as needed for runny nose/drainage. Nasal saline spray (i.e., Simply Saline) or nasal saline lavage (i.e., NeilMed) is recommended as needed and prior to medicated nasal sprays.   Gastroesophageal reflux disease, unspecified whether esophagitis present Past history - Not sure if PPI helping. Interim history - started to take PPI more regularly and noticed some benefit. Sometimes coughing occurs right after meals.  See handout for lifestyle and dietary modifications. Pantoprazole  40mg  twice day - nothing to eat or drink for 20-30 minutes afterwards.    History of Hymenoptera allergy Past history - Reaction to yellow jacket and was on VIT in the past. No recent sting and no reactions. Continue to avoid.  Return in about 2 months (around 04/15/2024).  Meds ordered this encounter  Medications   pantoprazole  (PROTONIX ) 40 MG tablet    Sig: Take 1 tablet (40 mg total) by mouth 2 (two) times daily.    Dispense:  60 tablet    Refill:  2   Lab Orders  No laboratory test(s) ordered today    Diagnostics: Spirometry:  Tracings reviewed. His effort: Good reproducible efforts. FVC: 2.01L FEV1: 1.29L, 42% predicted FEV1/FVC ratio: 64% Interpretation: Spirometry consistent with possible restrictive disease.  Please see scanned spirometry results for  details.  Results discussed with patient/family.   Medication List:  Current Outpatient Medications  Medication Sig Dispense Refill   albuterol  (VENTOLIN  HFA) 108 (90 Base) MCG/ACT inhaler Inhale 2 puffs into the lungs every 4 (four) hours as needed for wheezing or shortness of breath (coughing fits). 18 g 1   azelastine  (ASTELIN ) 0.1 % nasal spray Place 1-2 sprays into both nostrils 2 (two) times daily as needed (nasal drainage). Use in each nostril as directed 30 mL 5   cetirizine (ZYRTEC) 10 MG tablet Take 10 mg by mouth daily.     famotidine  (PEPCID ) 40 MG tablet Take 40 mg by mouth once as needed for heartburn or indigestion.     pantoprazole  (PROTONIX ) 40 MG tablet Take 1 tablet (40 mg total) by mouth 2 (two) times daily. 60 tablet 2   rosuvastatin (CRESTOR) 5 MG tablet Take 5 mg by mouth daily.     valsartan-hydrochlorothiazide (DIOVAN-HCT) 320-12.5 MG tablet Take 1 tablet by mouth daily.     WIXELA INHUB 250-50 MCG/ACT AEPB INHALE 1 PUFF INTO THE LUNGS IN THE MORNING AND AT BEDTIME. RINSE MOUTH AFTER EACH USE. 60 each 3   aspirin EC 81 MG tablet Take 81 mg by mouth daily. Swallow whole. (Patient not taking: Reported on 09/19/2023)     No current facility-administered medications for this visit.   Allergies: Allergies[1] I reviewed his past medical history, social history, family history, and environmental history and no significant changes have been reported from his previous visit.  Review of Systems  Constitutional:  Negative for appetite change, chills, fever and unexpected weight change.  HENT:  Negative for congestion, postnasal drip and rhinorrhea.   Eyes:  Negative for itching.  Respiratory:  Positive for cough. Negative for chest tightness, shortness of breath and wheezing.   Cardiovascular:  Negative for chest pain.  Gastrointestinal:  Negative for abdominal pain.  Genitourinary:  Negative for difficulty urinating.  Skin:  Negative for rash.  Allergic/Immunologic:  Positive for environmental allergies.  Neurological:  Negative for headaches.    Objective: BP (!) 136/90 (BP Location: Left Arm, Patient Position: Sitting, Cuff Size: Normal)   Pulse (!) 52   Temp 98.4 F (36.9 C) (Temporal)   Resp 14   Ht 5' 9 (1.753 m)   Wt 262 lb (118.8 kg)   SpO2 98%   BMI 38.69 kg/m  Body mass index is 38.69 kg/m. Physical Exam Vitals and nursing note reviewed.  Constitutional:      Appearance: Normal appearance. He is well-developed. He is obese.  HENT:     Head: Normocephalic and atraumatic.     Right Ear: Tympanic membrane and external ear normal.     Left Ear: Tympanic membrane and external ear normal.     Nose: Nose normal.     Mouth/Throat:     Mouth: Mucous membranes are moist.     Pharynx: Oropharynx is clear.  Eyes:     Conjunctiva/sclera: Conjunctivae normal.  Cardiovascular:     Rate and Rhythm: Normal rate and regular rhythm.     Heart sounds: Normal heart sounds. No murmur heard.    No friction rub. No gallop.  Pulmonary:     Effort: Pulmonary effort is normal.     Breath sounds: Normal breath sounds. No wheezing, rhonchi or rales.  Musculoskeletal:     Cervical back: Neck supple.  Skin:    General: Skin is warm.     Findings: No rash.  Neurological:     Mental Status: He is alert and oriented to person, place, and time.  Psychiatric:        Behavior: Behavior normal.    Previous notes and tests were reviewed. The plan was reviewed with the patient/family, and all questions/concerned were addressed.  It was my pleasure to see Alfred Davis today and participate in his care. Please feel free to contact me with any questions or concerns.  Sincerely,  Orlan Cramp, DO Allergy & Immunology  [facility]  [facility] office: [phone] [facility] office: [phone]     [1] No Known Allergies

## 2024-02-19 ENCOUNTER — Other Ambulatory Visit: Payer: Self-pay | Admitting: Internal Medicine

## 2024-02-19 DIAGNOSIS — R058 Other specified cough: Secondary | ICD-10-CM

## 2024-04-16 ENCOUNTER — Ambulatory Visit: Admitting: Allergy
